# Patient Record
Sex: Female | Born: 1985 | Race: White | Hispanic: No | Marital: Married | Smoking: Never smoker
Health system: Southern US, Community
[De-identification: ages and names within clinical notes are randomized; demographics above are authoritative.]

## PROBLEM LIST (undated history)

## (undated) DIAGNOSIS — Z789 Other specified health status: Secondary | ICD-10-CM

## (undated) HISTORY — DX: Other specified health status: Z78.9

## (undated) HISTORY — PX: NO PAST SURGERIES: SHX2092

## (undated) HISTORY — PX: WISDOM TOOTH EXTRACTION: SHX21

---

## 2013-03-17 ENCOUNTER — Encounter: Payer: Self-pay | Admitting: *Deleted

## 2013-03-24 ENCOUNTER — Encounter: Payer: Self-pay | Admitting: Family

## 2013-03-24 ENCOUNTER — Ambulatory Visit (INDEPENDENT_AMBULATORY_CARE_PROVIDER_SITE_OTHER): Payer: BC Managed Care – PPO | Admitting: Family

## 2013-03-24 VITALS — BP 127/86 | Ht 66.0 in | Wt 159.9 lb

## 2013-03-24 DIAGNOSIS — Z3400 Encounter for supervision of normal first pregnancy, unspecified trimester: Secondary | ICD-10-CM | POA: Insufficient documentation

## 2013-03-24 LAB — POCT URINALYSIS DIP (DEVICE)
Bilirubin Urine: NEGATIVE
Hgb urine dipstick: NEGATIVE
Ketones, ur: NEGATIVE mg/dL
Protein, ur: NEGATIVE mg/dL
Specific Gravity, Urine: 1.015 (ref 1.005–1.030)
pH: 7.5 (ref 5.0–8.0)

## 2013-03-24 NOTE — Patient Instructions (Signed)
Second Trimester of Pregnancy The second trimester is from week 13 through week 28, months 4 through 6. The second trimester is often a time when you feel your best. Your body has also adjusted to being pregnant, and you begin to feel better physically. Usually, morning sickness has lessened or quit completely, you may have more energy, and you may have an increase in appetite. The second trimester is also a time when the fetus is growing rapidly. At the end of the sixth month, the fetus is about 9 inches long and weighs about 1 pounds. You will likely begin to feel the baby move (quickening) between 18 and 20 weeks of the pregnancy. BODY CHANGES Your body goes through many changes during pregnancy. The changes vary from woman to woman.   Your weight will continue to increase. You will notice your lower abdomen bulging out.  You may begin to get stretch marks on your hips, abdomen, and breasts.  You may develop headaches that can be relieved by medicines approved by your caregiver.  You may urinate more often because the fetus is pressing on your bladder.  You may develop or continue to have heartburn as a result of your pregnancy.  You may develop constipation because certain hormones are causing the muscles that push waste through your intestines to slow down.  You may develop hemorrhoids or swollen, bulging veins (varicose veins).  You may have back pain because of the weight gain and pregnancy hormones relaxing your joints between the bones in your pelvis and as a result of a shift in weight and the muscles that support your balance.  Your breasts will continue to grow and be tender.  Your gums may bleed and may be sensitive to brushing and flossing.  Dark spots or blotches (chloasma, mask of pregnancy) may develop on your face. This will likely fade after the baby is born.  A dark line from your belly button to the pubic area (linea nigra) may appear. This will likely fade after the  baby is born. WHAT TO EXPECT AT YOUR PRENATAL VISITS During a routine prenatal visit:  You will be weighed to make sure you and the fetus are growing normally.  Your blood pressure will be taken.  Your abdomen will be measured to track your baby's growth.  The fetal heartbeat will be listened to.  Any test results from the previous visit will be discussed. Your caregiver may ask you:  How you are feeling.  If you are feeling the baby move.  If you have had any abnormal symptoms, such as leaking fluid, bleeding, severe headaches, or abdominal cramping.  If you have any questions. Other tests that may be performed during your second trimester include:  Blood tests that check for:  Low iron levels (anemia).  Gestational diabetes (between 24 and 28 weeks).  Rh antibodies.  Urine tests to check for infections, diabetes, or protein in the urine.  An ultrasound to confirm the proper growth and development of the baby.  An amniocentesis to check for possible genetic problems.  Fetal screens for spina bifida and Down syndrome. HOME CARE INSTRUCTIONS   Avoid all smoking, herbs, alcohol, and unprescribed drugs. These chemicals affect the formation and growth of the baby.  Follow your caregiver's instructions regarding medicine use. There are medicines that are either safe or unsafe to take during pregnancy.  Exercise only as directed by your caregiver. Experiencing uterine cramps is a good sign to stop exercising.  Continue to eat regular,   healthy meals.  Wear a good support bra for breast tenderness.  Do not use hot tubs, steam rooms, or saunas.  Wear your seat belt at all times when driving.  Avoid raw meat, uncooked cheese, cat litter boxes, and soil used by cats. These carry germs that can cause birth defects in the baby.  Take your prenatal vitamins.  Try taking a stool softener (if your caregiver approves) if you develop constipation. Eat more high-fiber foods,  such as fresh vegetables or fruit and whole grains. Drink plenty of fluids to keep your urine clear or pale yellow.  Take warm sitz baths to soothe any pain or discomfort caused by hemorrhoids. Use hemorrhoid cream if your caregiver approves.  If you develop varicose veins, wear support hose. Elevate your feet for 15 minutes, 3 4 times a day. Limit salt in your diet.  Avoid heavy lifting, wear low heel shoes, and practice good posture.  Rest with your legs elevated if you have leg cramps or low back pain.  Visit your dentist if you have not gone yet during your pregnancy. Use a soft toothbrush to brush your teeth and be gentle when you floss.  A sexual relationship may be continued unless your caregiver directs you otherwise.  Continue to go to all your prenatal visits as directed by your caregiver. SEEK MEDICAL CARE IF:   You have dizziness.  You have mild pelvic cramps, pelvic pressure, or nagging pain in the abdominal area.  You have persistent nausea, vomiting, or diarrhea.  You have a bad smelling vaginal discharge.  You have pain with urination. SEEK IMMEDIATE MEDICAL CARE IF:   You have a fever.  You are leaking fluid from your vagina.  You have spotting or bleeding from your vagina.  You have severe abdominal cramping or pain.  You have rapid weight gain or loss.  You have shortness of breath with chest pain.  You notice sudden or extreme swelling of your face, hands, ankles, feet, or legs.  You have not felt your baby move in over an hour.  You have severe headaches that do not go away with medicine.  You have vision changes. Document Released: 04/09/2001 Document Revised: 12/16/2012 Document Reviewed: 06/16/2012 Exit[employer] Patient Information 2014 [employer].  

## 2013-03-24 NOTE — Progress Notes (Signed)
Pulse- 94 

## 2013-03-24 NOTE — Progress Notes (Signed)
   Subjective:    Danielle Rubio is a G1P0 [redacted]w[redacted]d being seen today for her first obstetrical visit.  Her obstetrical history is significant for no risk factors identified.[name] Desires waterbirth.  Getting married in January and also completing school (Education degree).   Patient does intend to breast feed. Pregnancy history fully reviewed.  Patient reports no complaints.  Filed Vitals:   03/24/13 0844 03/24/13 0847  BP: 127/86   Height:  5\' 6"  (1.676 m)  Weight: 72.53 kg (159 lb 14.4 oz)     HISTORY: OB History  Gravida Para Term Preterm AB SAB TAB Ectopic Multiple Living  1             # Outcome Date GA Lbr Len/2nd Weight Sex Delivery Anes PTL Lv  1 CUR              Past Medical History  Diagnosis Date  . Medical history non-contributory    Past Surgical History  Procedure Laterality Date  . Wisdom tooth extraction    . No past surgeries     Family History  Problem Relation Age of Onset  . Cancer Maternal Aunt     bladder cancer  . Alzheimer's disease Maternal Grandmother   . Cancer Maternal Grandfather     pancreatic     Exam   Exam   BP 127/86  Ht 5\' 6"  (1.676 m)  Wt 72.53 kg (159 lb 14.4 oz)  BMI 25.82 kg/m2  LMP 12/15/2012 Uterine Size: size equals dates  Pelvic Exam:    Perineum: No Hemorrhoids, Normal Perineum   Vulva: normal   Vagina:  normal mucosa, normal discharge, no palpable nodules   pH: Not done   Cervix: no bleeding following Pap, no cervical motion tenderness and no lesions   Adnexa: normal adnexa and no mass, fullness, tenderness   Bony Pelvis: Adequate  System: Breast:  No nipple retraction or dimpling, No nipple discharge or bleeding, No axillary or supraclavicular adenopathy, Normal to palpation without dominant masses   Skin: normal coloration and turgor, no rashes    Neurologic: negative   Extremities: normal strength, tone, and muscle mass   HEENT neck supple with midline trachea and thyroid without masses   Mouth/Teeth mucous  membranes moist, pharynx normal without lesions   Neck supple and no masses   Cardiovascular: regular rate and rhythm, no murmurs or gallops   Respiratory:  appears well, vitals normal, no respiratory distress, acyanotic, normal RR, neck free of mass or lymphadenopathy, chest clear, no wheezing, crepitations, rhonchi, normal symmetric air entry   Abdomen: soft, non-tender; bowel sounds normal; no masses,  no organomegaly   Urinary: urethral meatus normal       Assessment:    Pregnancy: G1P0 Patient Active Problem List   Diagnosis Date Noted  . Supervision of normal first pregnancy 03/24/2013        Plan:     Initial labs drawn. Prenatal vitamins. Problem list reviewed and updated. Genetic Screening discussed : : declined.  Ultrasound discussed; fetal survey: ordered in 4 weeks.  Follow up in 4 weeks. Discussed waterbirth class requirement at [facility].  Given information regarding local doula services.    [facility] 03/24/2013

## 2013-03-25 LAB — OBSTETRIC PANEL
Basophils Absolute: 0 10*3/uL (ref 0.0–0.1)
Basophils Relative: 0 % (ref 0–1)
Hemoglobin: 11.7 g/dL — ABNORMAL LOW (ref 12.0–15.0)
Hepatitis B Surface Ag: NEGATIVE
Lymphocytes Relative: 27 % (ref 12–46)
MCHC: 35 g/dL (ref 30.0–36.0)
Neutro Abs: 5 10*3/uL (ref 1.7–7.7)
Neutrophils Relative %: 66 % (ref 43–77)
RDW: 15 % (ref 11.5–15.5)
WBC: 7.6 10*3/uL (ref 4.0–10.5)

## 2013-03-25 LAB — PRESCRIPTION MONITORING PROFILE (19 PANEL)
Amphetamine/Meth: NEGATIVE ng/mL
Barbiturate Screen, Urine: NEGATIVE ng/mL
Benzodiazepine Screen, Urine: NEGATIVE ng/mL
Buprenorphine, Urine: NEGATIVE ng/mL
Cocaine Metabolites: NEGATIVE ng/mL
Creatinine, Urine: 88.86 mg/dL (ref >20.0)
Fentanyl, Ur: NEGATIVE ng/mL
MDMA URINE: NEGATIVE ng/mL
Meperidine, Ur: NEGATIVE ng/mL
Nitrites, Initial: NEGATIVE ug/mL
Opiate Screen, Urine: NEGATIVE ng/mL
Oxycodone Screen, Ur: NEGATIVE ng/mL
Propoxyphene: NEGATIVE ng/mL
Tramadol Scrn, Ur: NEGATIVE ng/mL
pH, Initial: 7.6 pH (ref 4.5–8.9)

## 2013-03-25 LAB — ALCOHOL METABOLITE (ETG), URINE: Ethyl Glucuronide (EtG): NEGATIVE ng/mL

## 2013-03-27 LAB — CULTURE, OB URINE

## 2013-03-30 ENCOUNTER — Other Ambulatory Visit: Payer: Self-pay | Admitting: Family

## 2013-03-30 ENCOUNTER — Encounter: Payer: Self-pay | Admitting: Family

## 2013-03-30 MED ORDER — AMOXICILLIN 500 MG PO CAPS: 500.0000 mg | ORAL_CAPSULE | Freq: Three times a day (TID) | ORAL | Status: DC

## 2013-03-30 NOTE — Progress Notes (Signed)
Pt notified regarding UTI and RX sent to pharmacy for Amoxicillin.

## 2013-04-21 ENCOUNTER — Other Ambulatory Visit: Payer: Self-pay | Admitting: Family

## 2013-04-21 ENCOUNTER — Ambulatory Visit (HOSPITAL_COMMUNITY)
Admission: RE | Admit: 2013-04-21 | Discharge: 2013-04-21 | Disposition: A | Discharge status: Home / Self Care | Payer: BC Managed Care – PPO | Source: Ambulatory Visit | Attending: Family | Admitting: Family

## 2013-04-21 ENCOUNTER — Ambulatory Visit (INDEPENDENT_AMBULATORY_CARE_PROVIDER_SITE_OTHER): Payer: BC Managed Care – PPO | Admitting: Obstetrics and Gynecology

## 2013-04-21 ENCOUNTER — Encounter: Payer: Self-pay | Admitting: Obstetrics and Gynecology

## 2013-04-21 VITALS — BP 114/70 | Wt 163.6 lb

## 2013-04-21 DIAGNOSIS — Z3402 Encounter for supervision of normal first pregnancy, second trimester: Secondary | ICD-10-CM

## 2013-04-21 DIAGNOSIS — Z3689 Encounter for other specified antenatal screening: Secondary | ICD-10-CM | POA: Insufficient documentation

## 2013-04-21 DIAGNOSIS — Z3400 Encounter for supervision of normal first pregnancy, unspecified trimester: Secondary | ICD-10-CM

## 2013-04-21 LAB — POCT URINALYSIS DIP (DEVICE)
Hgb urine dipstick: NEGATIVE
Nitrite: NEGATIVE
Protein, ur: NEGATIVE mg/dL
Specific Gravity, Urine: 1.01 (ref 1.005–1.030)
Urobilinogen, UA: 0.2 mg/dL (ref 0.0–1.0)
pH: 6 (ref 5.0–8.0)

## 2013-04-21 NOTE — Progress Notes (Signed)
Patient doing well without complaints. Anatomy report reviewed.

## 2013-04-21 NOTE — Progress Notes (Signed)
Pulse: 74

## 2013-04-22 ENCOUNTER — Encounter: Payer: Self-pay | Admitting: Family

## 2013-04-29 NOTE — L&D Delivery Note (Signed)
Attestation of Attending Supervision of Advanced Practitioner: Evaluation and management procedures were performed by the PA/NP/CNM/OB Fellow under my supervision/collaboration. Chart reviewed and agree with management and plan.  Eulon Allnutt V 10/07/2013 12:00 PM

## 2013-04-29 NOTE — L&D Delivery Note (Signed)
Delivery Note Pt started to receive Pitocin at approx 2am after 23hrs of PROM.  Shortly after 5am she was complete and began pushing. At 6:04 AM a viable female was delivered in the tub via  (Presentation: Left Occiput Anterior).  APGAR:7 ,8 ; weight: pending.  Infant delivered and lifted to pt's abd and dried. Cord clamped and cut by FOB. Placenta status: Intact, Spontaneous.  Cord: 3 vessels with the following complications: nuchal x 1, reduced after delivery.    Anesthesia: 1% lidocaine  Episiotomy: None Lacerations: right labial 'split' Suture Repair: 3.0 vicryl Est. Blood Loss (mL): 300  Mom to postpartum.  Baby to Couplet care / Skin to Skin.  [name] CNM 10/03/2013, 6:45 AM

## 2013-05-25 ENCOUNTER — Encounter: Payer: Self-pay | Admitting: Obstetrics and Gynecology

## 2013-05-25 ENCOUNTER — Ambulatory Visit (INDEPENDENT_AMBULATORY_CARE_PROVIDER_SITE_OTHER): Payer: BC Managed Care – PPO | Admitting: Obstetrics and Gynecology

## 2013-05-25 VITALS — BP 126/77 | Temp 97.1°F | Wt 171.0 lb

## 2013-05-25 DIAGNOSIS — Z3400 Encounter for supervision of normal first pregnancy, unspecified trimester: Secondary | ICD-10-CM

## 2013-05-25 LAB — POCT URINALYSIS DIP (DEVICE)
BILIRUBIN URINE: NEGATIVE
GLUCOSE, UA: NEGATIVE mg/dL
HGB URINE DIPSTICK: NEGATIVE
KETONES UR: NEGATIVE mg/dL
Leukocytes, UA: NEGATIVE
Nitrite: NEGATIVE
Protein, ur: NEGATIVE mg/dL
Urobilinogen, UA: 0.2 mg/dL (ref 0.0–1.0)
pH: 5.5 (ref 5.0–8.0)

## 2013-05-25 NOTE — Progress Notes (Signed)
Doing well. Systems [place]ud[employer] [place] for [place]birth. Reviewed [place] with pt.

## 2013-05-25 NOTE — Progress Notes (Signed)
p-77 

## 2013-05-25 NOTE — Patient Instructions (Signed)
Second Trimester of Pregnancy The second trimester is from week 13 through week 28, months 4 through 6. The second trimester is often a time when you feel your best. Your body has also adjusted to being pregnant, and you begin to feel better physically. Usually, morning sickness has lessened or quit completely, you may have more energy, and you may have an increase in appetite. The second trimester is also a time when the fetus is growing rapidly. At the end of the sixth month, the fetus is about 9 inches long and weighs about 1 pounds. You will likely begin to feel the baby move (quickening) between 18 and 20 weeks of the pregnancy. BODY CHANGES Your body goes through many changes during pregnancy. The changes vary from woman to woman.   Your weight will continue to increase. You will notice your lower abdomen bulging out.  You may begin to get stretch marks on your hips, abdomen, and breasts.  You may develop headaches that can be relieved by medicines approved by your caregiver.  You may urinate more often because the fetus is pressing on your bladder.  You may develop or continue to have heartburn as a result of your pregnancy.  You may develop constipation because certain hormones are causing the muscles that push waste through your intestines to slow down.  You may develop hemorrhoids or swollen, bulging veins (varicose veins).  You may have back pain because of the weight gain and pregnancy hormones relaxing your joints between the bones in your pelvis and as a result of a shift in weight and the muscles that support your balance.  Your breasts will continue to grow and be tender.  Your gums may bleed and may be sensitive to brushing and flossing.  Dark spots or blotches (chloasma, mask of pregnancy) may develop on your face. This will likely fade after the baby is born.  A dark line from your belly button to the pubic area (linea nigra) may appear. This will likely fade after the  baby is born. WHAT TO EXPECT AT YOUR PRENATAL VISITS During a routine prenatal visit:  You will be weighed to make sure you and the fetus are growing normally.  Your blood pressure will be taken.  Your abdomen will be measured to track your baby's growth.  The fetal heartbeat will be listened to.  Any test results from the previous visit will be discussed. Your caregiver may ask you:  How you are feeling.  If you are feeling the baby move.  If you have had any abnormal symptoms, such as leaking fluid, bleeding, severe headaches, or abdominal cramping.  If you have any questions. Other tests that may be performed during your second trimester include:  Blood tests that check for:  Low iron levels (anemia).  Gestational diabetes (between 24 and 28 weeks).  Rh antibodies.  Urine tests to check for infections, diabetes, or protein in the urine.  An ultrasound to confirm the proper growth and development of the baby.  An amniocentesis to check for possible genetic problems.  Fetal screens for spina bifida and Down syndrome. HOME CARE INSTRUCTIONS   Avoid all smoking, herbs, alcohol, and unprescribed drugs. These chemicals affect the formation and growth of the baby.  Follow your caregiver's instructions regarding medicine use. There are medicines that are either safe or unsafe to take during pregnancy.  Exercise only as directed by your caregiver. Experiencing uterine cramps is a good sign to stop exercising.  Continue to eat regular,   healthy meals.  Wear a good support bra for breast tenderness.  Do not use hot tubs, steam rooms, or saunas.  Wear your seat belt at all times when driving.  Avoid raw meat, uncooked cheese, cat litter boxes, and soil used by cats. These carry germs that can cause birth defects in the baby.  Take your prenatal vitamins.  Try taking a stool softener (if your caregiver approves) if you develop constipation. Eat more high-fiber foods,  such as fresh vegetables or fruit and whole grains. Drink plenty of fluids to keep your urine clear or pale yellow.  Take warm sitz baths to soothe any pain or discomfort caused by hemorrhoids. Use hemorrhoid cream if your caregiver approves.  If you develop varicose veins, wear support hose. Elevate your feet for 15 minutes, 3 4 times a day. Limit salt in your diet.  Avoid heavy lifting, wear low heel shoes, and practice good posture.  Rest with your legs elevated if you have leg cramps or low back pain.  Visit your dentist if you have not gone yet during your pregnancy. Use a soft toothbrush to brush your teeth and be gentle when you floss.  A sexual relationship may be continued unless your caregiver directs you otherwise.  Continue to go to all your prenatal visits as directed by your caregiver. SEEK MEDICAL CARE IF:   You have dizziness.  You have mild pelvic cramps, pelvic pressure, or nagging pain in the abdominal area.  You have persistent nausea, vomiting, or diarrhea.  You have a bad smelling vaginal discharge.  You have pain with urination. SEEK IMMEDIATE MEDICAL CARE IF:   You have a fever.  You are leaking fluid from your vagina.  You have spotting or bleeding from your vagina.  You have severe abdominal cramping or pain.  You have rapid weight gain or loss.  You have shortness of breath with chest pain.  You notice sudden or extreme swelling of your face, hands, ankles, feet, or legs.  You have not felt your baby move in over an hour.  You have severe headaches that do not go away with medicine.  You have vision changes. Document Released: 04/09/2001 Document Revised: 12/16/2012 Document Reviewed: 06/16/2012 Exit[employer] Patient Information 2014 [employer].  

## 2013-06-22 ENCOUNTER — Encounter: Payer: Self-pay | Admitting: Family Medicine

## 2013-06-22 ENCOUNTER — Ambulatory Visit (INDEPENDENT_AMBULATORY_CARE_PROVIDER_SITE_OTHER): Payer: BC Managed Care – PPO | Admitting: Family Medicine

## 2013-06-22 VITALS — BP 131/78 | Temp 96.4°F | Wt 181.3 lb

## 2013-06-22 DIAGNOSIS — Z3400 Encounter for supervision of normal first pregnancy, unspecified trimester: Secondary | ICD-10-CM

## 2013-06-22 LAB — POCT URINALYSIS DIP (DEVICE)
BILIRUBIN URINE: NEGATIVE
GLUCOSE, UA: NEGATIVE mg/dL
HGB URINE DIPSTICK: NEGATIVE
Ketones, ur: NEGATIVE mg/dL
Leukocytes, UA: NEGATIVE
Nitrite: NEGATIVE
Protein, ur: NEGATIVE mg/dL
SPECIFIC GRAVITY, URINE: 1.01 (ref 1.005–1.030)
UROBILINOGEN UA: 0.2 mg/dL (ref 0.0–1.0)
pH: 6 (ref 5.0–8.0)

## 2013-06-22 LAB — CBC
HEMATOCRIT: 32.6 % — AB (ref 36.0–46.0)
HEMOGLOBIN: 11.2 g/dL — AB (ref 12.0–15.0)
MCH: 31.7 pg (ref 26.0–34.0)
MCHC: 34.4 g/dL (ref 30.0–36.0)
MCV: 92.4 fL (ref 78.0–100.0)
Platelets: 181 10*3/uL (ref 150–400)
RBC: 3.53 MIL/uL — AB (ref 3.87–5.11)
RDW: 13.4 % (ref 11.5–15.5)
WBC: 8.9 10*3/uL (ref 4.0–10.5)

## 2013-06-22 NOTE — Progress Notes (Signed)
S: 28 yo G1 @ 3666w0d - still having occasional constipation - some heartburn - taking natural remedies - no ctx, lof, vb.  +FM  O: see flowsheet  A/P: - doing well _ PTL precautions discussed - 28 week labs today - f/u iat 30 weeks

## 2013-06-22 NOTE — Patient Instructions (Signed)
Third Trimester of Pregnancy  The third trimester is from week 29 through week 42, months 7 through 9. The third trimester is a time when the fetus is growing rapidly. At the end of the ninth month, the fetus is about 20 inches in length and weighs 6 10 pounds.   BODY CHANGES  Your body goes through many changes during pregnancy. The changes vary from woman to woman.    Your weight will continue to increase. You can expect to gain 25 35 pounds (11 16 kg) by the end of the pregnancy.   You may begin to get stretch marks on your hips, abdomen, and breasts.   You may urinate more often because the fetus is moving lower into your pelvis and pressing on your bladder.   You may develop or continue to have heartburn as a result of your pregnancy.   You may develop constipation because certain hormones are causing the muscles that push waste through your intestines to slow down.   You may develop hemorrhoids or swollen, bulging veins (varicose veins).   You may have pelvic pain because of the weight gain and pregnancy hormones relaxing your joints between the bones in your pelvis. Back aches may result from over exertion of the muscles supporting your posture.   Your breasts will continue to grow and be tender. A yellow discharge may leak from your breasts called colostrum.   Your belly button may stick out.   You may feel short of breath because of your expanding uterus.   You may notice the fetus "dropping," or moving lower in your abdomen.   You may have a bloody mucus discharge. This usually occurs a few days to a week before labor begins.   Your cervix becomes thin and soft (effaced) near your due date.  WHAT TO EXPECT AT YOUR PRENATAL EXAMS   You will have prenatal exams every 2 weeks until week 36. Then, you will have weekly prenatal exams. During a routine prenatal visit:   You will be weighed to make sure you and the fetus are growing normally.   Your blood pressure is taken.   Your abdomen will be  measured to track your baby's growth.   The fetal heartbeat will be listened to.   Any test results from the previous visit will be discussed.   You may have a cervical check near your due date to see if you have effaced.  At around 36 weeks, your caregiver will check your cervix. At the same time, your caregiver will also perform a test on the secretions of the vaginal tissue. This test is to determine if a type of bacteria, Group B streptococcus, is present. Your caregiver will explain this further.  Your caregiver may ask you:   What your birth plan is.   How you are feeling.   If you are feeling the baby move.   If you have had any abnormal symptoms, such as leaking fluid, bleeding, severe headaches, or abdominal cramping.   If you have any questions.  Other tests or screenings that may be performed during your third trimester include:   Blood tests that check for low iron levels (anemia).   Fetal testing to check the health, activity level, and growth of the fetus. Testing is done if you have certain medical conditions or if there are problems during the pregnancy.  FALSE LABOR  You may feel small, irregular contractions that eventually go away. These are called Braxton Hicks contractions, or   false labor. Contractions may last for hours, days, or even weeks before true labor sets in. If contractions come at regular intervals, intensify, or become painful, it is best to be seen by your caregiver.   SIGNS OF LABOR    Menstrual-like cramps.   Contractions that are 5 minutes apart or less.   Contractions that start on the top of the uterus and spread down to the lower abdomen and back.   A sense of increased pelvic pressure or back pain.   A watery or bloody mucus discharge that comes from the vagina.  If you have any of these signs before the 37th week of pregnancy, call your caregiver right away. You need to go to the hospital to get checked immediately.  HOME CARE INSTRUCTIONS    Avoid all  smoking, herbs, alcohol, and unprescribed drugs. These chemicals affect the formation and growth of the baby.   Follow your caregiver's instructions regarding medicine use. There are medicines that are either safe or unsafe to take during pregnancy.   Exercise only as directed by your caregiver. Experiencing uterine cramps is a good sign to stop exercising.   Continue to eat regular, healthy meals.   Wear a good support bra for breast tenderness.   Do not use hot tubs, steam rooms, or saunas.   Wear your seat belt at all times when driving.   Avoid raw meat, uncooked cheese, cat litter boxes, and soil used by cats. These carry germs that can cause birth defects in the baby.   Take your prenatal vitamins.   Try taking a stool softener (if your caregiver approves) if you develop constipation. Eat more high-fiber foods, such as fresh vegetables or fruit and whole grains. Drink plenty of fluids to keep your urine clear or pale yellow.   Take warm sitz baths to soothe any pain or discomfort caused by hemorrhoids. Use hemorrhoid cream if your caregiver approves.   If you develop varicose veins, wear support hose. Elevate your feet for 15 minutes, 3 4 times a day. Limit salt in your diet.   Avoid heavy lifting, wear low heal shoes, and practice good posture.   Rest a lot with your legs elevated if you have leg cramps or low back pain.   Visit your dentist if you have not gone during your pregnancy. Use a soft toothbrush to brush your teeth and be gentle when you floss.   A sexual relationship may be continued unless your caregiver directs you otherwise.   Do not travel far distances unless it is absolutely necessary and only with the approval of your caregiver.   Take prenatal classes to understand, practice, and ask questions about the labor and delivery.   Make a trial run to the hospital.   Pack your hospital bag.   Prepare the baby's nursery.   Continue to go to all your prenatal visits as directed  by your caregiver.  SEEK MEDICAL CARE IF:   You are unsure if you are in labor or if your water has broken.   You have dizziness.   You have mild pelvic cramps, pelvic pressure, or nagging pain in your abdominal area.   You have persistent nausea, vomiting, or diarrhea.   You have a bad smelling vaginal discharge.   You have pain with urination.  SEEK IMMEDIATE MEDICAL CARE IF:    You have a fever.   You are leaking fluid from your vagina.   You have spotting or bleeding from your vagina.     You have severe abdominal cramping or pain.   You have rapid weight loss or gain.   You have shortness of breath with chest pain.   You notice sudden or extreme swelling of your face, hands, ankles, feet, or legs.   You have not felt your baby move in over an hour.   You have severe headaches that do not go away with medicine.   You have vision changes.  Document Released: 04/09/2001 Document Revised: 12/16/2012 Document Reviewed: 06/16/2012  ExitCare Patient Information 2014 [employer].

## 2013-06-22 NOTE — Progress Notes (Signed)
Pulse- 96  Edema-ankles

## 2013-06-23 LAB — HIV ANTIBODY (ROUTINE TESTING W REFLEX): HIV: NONREACTIVE

## 2013-06-23 LAB — GLUCOSE TOLERANCE, 1 HOUR (50G) W/O FASTING: Glucose, 1 Hour GTT: 124 mg/dL (ref 70–140)

## 2013-06-23 LAB — SYPHILIS: RPR W/REFLEX TO RPR TITER AND TREPONEMAL ANTIBODIES, TRADITIONAL SCREENING AND DIAGNOSIS ALGORITHM

## 2013-06-25 ENCOUNTER — Encounter: Payer: Self-pay | Admitting: Family Medicine

## 2013-07-13 ENCOUNTER — Ambulatory Visit (INDEPENDENT_AMBULATORY_CARE_PROVIDER_SITE_OTHER): Payer: BC Managed Care – PPO | Admitting: Obstetrics and Gynecology

## 2013-07-13 VITALS — Temp 97.5°F | Wt 184.8 lb

## 2013-07-13 DIAGNOSIS — Z3400 Encounter for supervision of normal first pregnancy, unspecified trimester: Secondary | ICD-10-CM

## 2013-07-13 LAB — POCT URINALYSIS DIP (DEVICE)
BILIRUBIN URINE: NEGATIVE
GLUCOSE, UA: NEGATIVE mg/dL
HGB URINE DIPSTICK: NEGATIVE
Ketones, ur: 15 mg/dL — AB
LEUKOCYTES UA: NEGATIVE
NITRITE: NEGATIVE
PH: 5.5 (ref 5.0–8.0)
Protein, ur: NEGATIVE mg/dL
Specific Gravity, Urine: <=1.005 (ref 1.005–1.030)
Urobilinogen, UA: 0.2 mg/dL (ref 0.0–1.0)

## 2013-07-13 NOTE — Progress Notes (Signed)
P= 88 Mild edema in ankles especially after working.  Pt. States she got tdap in October but has been waiting on getting the records.

## 2013-07-13 NOTE — Progress Notes (Signed)
Evaluation and management procedures were performed by SNM under my supervision/collaboration. Chart reviewed, patient examined by me and I agree with management and plan. 

## 2013-07-13 NOTE — Progress Notes (Signed)
Pt has no complaints.  Reports occasional contractions, +FM. Denies LOF and VB.  Requested that next visit be in 3 wks because she lives 45 min away.

## 2013-07-13 NOTE — Patient Instructions (Signed)
Third Trimester of Pregnancy  The third trimester is from week 29 through week 42, months 7 through 9. The third trimester is a time when the fetus is growing rapidly. At the end of the ninth month, the fetus is about 20 inches in length and weighs 6 10 pounds.   BODY CHANGES  Your body goes through many changes during pregnancy. The changes vary from woman to woman.    Your weight will continue to increase. You can expect to gain 25 35 pounds (11 16 kg) by the end of the pregnancy.   You may begin to get stretch marks on your hips, abdomen, and breasts.   You may urinate more often because the fetus is moving lower into your pelvis and pressing on your bladder.   You may develop or continue to have heartburn as a result of your pregnancy.   You may develop constipation because certain hormones are causing the muscles that push waste through your intestines to slow down.   You may develop hemorrhoids or swollen, bulging veins (varicose veins).   You may have pelvic pain because of the weight gain and pregnancy hormones relaxing your joints between the bones in your pelvis. Back aches may result from over exertion of the muscles supporting your posture.   Your breasts will continue to grow and be tender. A yellow discharge may leak from your breasts called colostrum.   Your belly button may stick out.   You may feel short of breath because of your expanding uterus.   You may notice the fetus "dropping," or moving lower in your abdomen.   You may have a bloody mucus discharge. This usually occurs a few days to a week before labor begins.   Your cervix becomes thin and soft (effaced) near your due date.  WHAT TO EXPECT AT YOUR PRENATAL EXAMS   You will have prenatal exams every 2 weeks until week 36. Then, you will have weekly prenatal exams. During a routine prenatal visit:   You will be weighed to make sure you and the fetus are growing normally.   Your blood pressure is taken.   Your abdomen will be  measured to track your baby's growth.   The fetal heartbeat will be listened to.   Any test results from the previous visit will be discussed.   You may have a cervical check near your due date to see if you have effaced.  At around 36 weeks, your caregiver will check your cervix. At the same time, your caregiver will also perform a test on the secretions of the vaginal tissue. This test is to determine if a type of bacteria, Group B streptococcus, is present. Your caregiver will explain this further.  Your caregiver may ask you:   What your birth plan is.   How you are feeling.   If you are feeling the baby move.   If you have had any abnormal symptoms, such as leaking fluid, bleeding, severe headaches, or abdominal cramping.   If you have any questions.  Other tests or screenings that may be performed during your third trimester include:   Blood tests that check for low iron levels (anemia).   Fetal testing to check the health, activity level, and growth of the fetus. Testing is done if you have certain medical conditions or if there are problems during the pregnancy.  FALSE LABOR  You may feel small, irregular contractions that eventually go away. These are called Braxton Hicks contractions, or   false labor. Contractions may last for hours, days, or even weeks before true labor sets in. If contractions come at regular intervals, intensify, or become painful, it is best to be seen by your caregiver.   SIGNS OF LABOR    Menstrual-like cramps.   Contractions that are 5 minutes apart or less.   Contractions that start on the top of the uterus and spread down to the lower abdomen and back.   A sense of increased pelvic pressure or back pain.   A watery or bloody mucus discharge that comes from the vagina.  If you have any of these signs before the 37th week of pregnancy, call your caregiver right away. You need to go to the hospital to get checked immediately.  HOME CARE INSTRUCTIONS    Avoid all  smoking, herbs, alcohol, and unprescribed drugs. These chemicals affect the formation and growth of the baby.   Follow your caregiver's instructions regarding medicine use. There are medicines that are either safe or unsafe to take during pregnancy.   Exercise only as directed by your caregiver. Experiencing uterine cramps is a good sign to stop exercising.   Continue to eat regular, healthy meals.   Wear a good support bra for breast tenderness.   Do not use hot tubs, steam rooms, or saunas.   Wear your seat belt at all times when driving.   Avoid raw meat, uncooked cheese, cat litter boxes, and soil used by cats. These carry germs that can cause birth defects in the baby.   Take your prenatal vitamins.   Try taking a stool softener (if your caregiver approves) if you develop constipation. Eat more high-fiber foods, such as fresh vegetables or fruit and whole grains. Drink plenty of fluids to keep your urine clear or pale yellow.   Take warm sitz baths to soothe any pain or discomfort caused by hemorrhoids. Use hemorrhoid cream if your caregiver approves.   If you develop varicose veins, wear support hose. Elevate your feet for 15 minutes, 3 4 times a day. Limit salt in your diet.   Avoid heavy lifting, wear low heal shoes, and practice good posture.   Rest a lot with your legs elevated if you have leg cramps or low back pain.   Visit your dentist if you have not gone during your pregnancy. Use a soft toothbrush to brush your teeth and be gentle when you floss.   A sexual relationship may be continued unless your caregiver directs you otherwise.   Do not travel far distances unless it is absolutely necessary and only with the approval of your caregiver.   Take prenatal classes to understand, practice, and ask questions about the labor and delivery.   Make a trial run to the hospital.   Pack your hospital bag.   Prepare the baby's nursery.   Continue to go to all your prenatal visits as directed  by your caregiver.  SEEK MEDICAL CARE IF:   You are unsure if you are in labor or if your water has broken.   You have dizziness.   You have mild pelvic cramps, pelvic pressure, or nagging pain in your abdominal area.   You have persistent nausea, vomiting, or diarrhea.   You have a bad smelling vaginal discharge.   You have pain with urination.  SEEK IMMEDIATE MEDICAL CARE IF:    You have a fever.   You are leaking fluid from your vagina.   You have spotting or bleeding from your vagina.     You have severe abdominal cramping or pain.   You have rapid weight loss or gain.   You have shortness of breath with chest pain.   You notice sudden or extreme swelling of your face, hands, ankles, feet, or legs.   You have not felt your baby move in over an hour.   You have severe headaches that do not go away with medicine.   You have vision changes.  Document Released: 04/09/2001 Document Revised: 12/16/2012 Document Reviewed: 06/16/2012  ExitCare Patient Information 2014 [employer].

## 2013-07-14 ENCOUNTER — Encounter: Payer: BC Managed Care – PPO | Admitting: Advanced Practice Midwife

## 2013-07-21 ENCOUNTER — Telehealth: Payer: Self-pay | Admitting: General Practice

## 2013-07-21 NOTE — Telephone Encounter (Signed)
patient called and left message stating that she is 31weeks now and up to this point she has been feeling okay but now she is starting to have a little pain in her pelvis and hips and wants to make sure this is okay. Called patient back and she stated they went out of town last week and she did a bunch of walking and is feeling sore now and still having some discomfort in hips and wasn't sure if this was normal since she is later in her pregnancy. Told patient that pressure and pain in her hips can be normal at this point but that severe pain wouldn't be and that if she is having mild pains she can try a heating pad or tylenol. Patient verbalized understanding and had no further questions

## 2013-07-28 ENCOUNTER — Encounter: Payer: Self-pay | Admitting: *Deleted

## 2013-08-03 ENCOUNTER — Ambulatory Visit (INDEPENDENT_AMBULATORY_CARE_PROVIDER_SITE_OTHER): Payer: BC Managed Care – PPO | Admitting: Family Medicine

## 2013-08-03 ENCOUNTER — Encounter: Payer: Self-pay | Admitting: Family Medicine

## 2013-08-03 VITALS — BP 124/81 | Temp 98.2°F | Wt 187.8 lb

## 2013-08-03 DIAGNOSIS — Z23 Encounter for immunization: Secondary | ICD-10-CM

## 2013-08-03 DIAGNOSIS — Z3400 Encounter for supervision of normal first pregnancy, unspecified trimester: Secondary | ICD-10-CM

## 2013-08-03 LAB — POCT URINALYSIS DIP (DEVICE)
BILIRUBIN URINE: NEGATIVE
Glucose, UA: NEGATIVE mg/dL
Hgb urine dipstick: NEGATIVE
Ketones, ur: NEGATIVE mg/dL
LEUKOCYTES UA: NEGATIVE
Nitrite: NEGATIVE
Protein, ur: NEGATIVE mg/dL
Specific Gravity, Urine: <=1.005 (ref 1.005–1.030)
Urobilinogen, UA: 0.2 mg/dL (ref 0.0–1.0)
pH: 6.5 (ref 5.0–8.0)

## 2013-08-03 MED ORDER — TETANUS-DIPHTH-ACELL PERTUSSIS 5-2.5-18.5 LF-MCG/0.5 IM SUSP: 0.5000 mL | Freq: Once | INTRAMUSCULAR | Status: DC

## 2013-08-03 NOTE — Patient Instructions (Signed)
Third Trimester of Pregnancy  The third trimester is from week 29 through week 42, months 7 through 9. The third trimester is a time when the fetus is growing rapidly. At the end of the ninth month, the fetus is about 20 inches in length and weighs 6 10 pounds.   BODY CHANGES  Your body goes through many changes during pregnancy. The changes vary from woman to woman.    Your weight will continue to increase. You can expect to gain 25 35 pounds (11 16 kg) by the end of the pregnancy.   You may begin to get stretch marks on your hips, abdomen, and breasts.   You may urinate more often because the fetus is moving lower into your pelvis and pressing on your bladder.   You may develop or continue to have heartburn as a result of your pregnancy.   You may develop constipation because certain hormones are causing the muscles that push waste through your intestines to slow down.   You may develop hemorrhoids or swollen, bulging veins (varicose veins).   You may have pelvic pain because of the weight gain and pregnancy hormones relaxing your joints between the bones in your pelvis. Back aches may result from over exertion of the muscles supporting your posture.   Your breasts will continue to grow and be tender. A yellow discharge may leak from your breasts called colostrum.   Your belly button may stick out.   You may feel short of breath because of your expanding uterus.   You may notice the fetus "dropping," or moving lower in your abdomen.   You may have a bloody mucus discharge. This usually occurs a few days to a week before labor begins.   Your cervix becomes thin and soft (effaced) near your due date.  WHAT TO EXPECT AT YOUR PRENATAL EXAMS   You will have prenatal exams every 2 weeks until week 36. Then, you will have weekly prenatal exams. During a routine prenatal visit:   You will be weighed to make sure you and the fetus are growing normally.   Your blood pressure is taken.   Your abdomen will be  measured to track your baby's growth.   The fetal heartbeat will be listened to.   Any test results from the previous visit will be discussed.   You may have a cervical check near your due date to see if you have effaced.  At around 36 weeks, your caregiver will check your cervix. At the same time, your caregiver will also perform a test on the secretions of the vaginal tissue. This test is to determine if a type of bacteria, Group B streptococcus, is present. Your caregiver will explain this further.  Your caregiver may ask you:   What your birth plan is.   How you are feeling.   If you are feeling the baby move.   If you have had any abnormal symptoms, such as leaking fluid, bleeding, severe headaches, or abdominal cramping.   If you have any questions.  Other tests or screenings that may be performed during your third trimester include:   Blood tests that check for low iron levels (anemia).   Fetal testing to check the health, activity level, and growth of the fetus. Testing is done if you have certain medical conditions or if there are problems during the pregnancy.  FALSE LABOR  You may feel small, irregular contractions that eventually go away. These are called Braxton Hicks contractions, or   false labor. Contractions may last for hours, days, or even weeks before true labor sets in. If contractions come at regular intervals, intensify, or become painful, it is best to be seen by your caregiver.   SIGNS OF LABOR    Menstrual-like cramps.   Contractions that are 5 minutes apart or less.   Contractions that start on the top of the uterus and spread down to the lower abdomen and back.   A sense of increased pelvic pressure or back pain.   A watery or bloody mucus discharge that comes from the vagina.  If you have any of these signs before the 37th week of pregnancy, call your caregiver right away. You need to go to the hospital to get checked immediately.  HOME CARE INSTRUCTIONS    Avoid all  smoking, herbs, alcohol, and unprescribed drugs. These chemicals affect the formation and growth of the baby.   Follow your caregiver's instructions regarding medicine use. There are medicines that are either safe or unsafe to take during pregnancy.   Exercise only as directed by your caregiver. Experiencing uterine cramps is a good sign to stop exercising.   Continue to eat regular, healthy meals.   Wear a good support bra for breast tenderness.   Do not use hot tubs, steam rooms, or saunas.   Wear your seat belt at all times when driving.   Avoid raw meat, uncooked cheese, cat litter boxes, and soil used by cats. These carry germs that can cause birth defects in the baby.   Take your prenatal vitamins.   Try taking a stool softener (if your caregiver approves) if you develop constipation. Eat more high-fiber foods, such as fresh vegetables or fruit and whole grains. Drink plenty of fluids to keep your urine clear or pale yellow.   Take warm sitz baths to soothe any pain or discomfort caused by hemorrhoids. Use hemorrhoid cream if your caregiver approves.   If you develop varicose veins, wear support hose. Elevate your feet for 15 minutes, 3 4 times a day. Limit salt in your diet.   Avoid heavy lifting, wear low heal shoes, and practice good posture.   Rest a lot with your legs elevated if you have leg cramps or low back pain.   Visit your dentist if you have not gone during your pregnancy. Use a soft toothbrush to brush your teeth and be gentle when you floss.   A sexual relationship may be continued unless your caregiver directs you otherwise.   Do not travel far distances unless it is absolutely necessary and only with the approval of your caregiver.   Take prenatal classes to understand, practice, and ask questions about the labor and delivery.   Make a trial run to the hospital.   Pack your hospital bag.   Prepare the baby's nursery.   Continue to go to all your prenatal visits as directed  by your caregiver.  SEEK MEDICAL CARE IF:   You are unsure if you are in labor or if your water has broken.   You have dizziness.   You have mild pelvic cramps, pelvic pressure, or nagging pain in your abdominal area.   You have persistent nausea, vomiting, or diarrhea.   You have a bad smelling vaginal discharge.   You have pain with urination.  SEEK IMMEDIATE MEDICAL CARE IF:    You have a fever.   You are leaking fluid from your vagina.   You have spotting or bleeding from your vagina.     You have severe abdominal cramping or pain.   You have rapid weight loss or gain.   You have shortness of breath with chest pain.   You notice sudden or extreme swelling of your face, hands, ankles, feet, or legs.   You have not felt your baby move in over an hour.   You have severe headaches that do not go away with medicine.   You have vision changes.  Document Released: 04/09/2001 Document Revised: 12/16/2012 Document Reviewed: 06/16/2012  ExitCare Patient Information 2014 [employer].

## 2013-08-03 NOTE — Progress Notes (Signed)
+  FM, no lof, no vb, 1 ctx  Hip pain improving. Near syncope at Dentist  Danielle Rubio is a 28 y.o. G1P0 at 5826w0d by L=18 here for ROB visit.  Discussed with Patient:  -Plans to breast feed.  All questions answered. -Continue prenatal vitamins. -Reviewed fetal kick counts Pt to perform daily at a time when the baby is active, lie laterally with both hands on belly in quiet room and count all movements (hiccups, shoulder rolls, obvious kicks, etc); pt is to report to clinic L&D for less than 10 movements felt in a one hour time period-pt told as soon as she counts 10 movements the count is complete.  - Routine precautions discussed (depression, infection s/s).   Patient provided with all pertinent phone numbers for emergencies. - RTC for any VB, regular, painful cramps/ctxs occurring at a rate of >2/10 min, fever (100.5 or higher), n/v/d, any pain that is unresolving or worsening, LOF, decreased fetal movement, CP, SOB, edema - RTC in 2 weeks for next appt.  Problems: Patient Active Problem List   Diagnosis Date Noted  . Supervision of normal first pregnancy 03/24/2013    To Do:  [ ]  Vaccines: tdap rec'd [ x] BCM: condoms [ x] Readiness: baby has a place to sleep, car seat, other baby necessities.  Edu: [x ] PTL precautions; [ ]  BF class; [ ]  childbirth class; [ ]   BF counseling;

## 2013-08-03 NOTE — Progress Notes (Signed)
PP=99,=  C/o pain in right hip since last visit. States was intense first week, but not as bad now. C/o occasional contractions.

## 2013-08-18 ENCOUNTER — Encounter: Payer: Self-pay | Admitting: Family Medicine

## 2013-08-18 ENCOUNTER — Ambulatory Visit (INDEPENDENT_AMBULATORY_CARE_PROVIDER_SITE_OTHER): Payer: BC Managed Care – PPO | Admitting: Family Medicine

## 2013-08-18 VITALS — BP 125/74 | HR 90 | Temp 97.1°F | Wt 187.3 lb

## 2013-08-18 DIAGNOSIS — Z3400 Encounter for supervision of normal first pregnancy, unspecified trimester: Secondary | ICD-10-CM

## 2013-08-18 LAB — POCT URINALYSIS DIP (DEVICE)
Bilirubin Urine: NEGATIVE
GLUCOSE, UA: NEGATIVE mg/dL
Hgb urine dipstick: NEGATIVE
KETONES UR: NEGATIVE mg/dL
Leukocytes, UA: NEGATIVE
Nitrite: NEGATIVE
Protein, ur: NEGATIVE mg/dL
Urobilinogen, UA: 0.2 mg/dL (ref 0.0–1.0)
pH: 5.5 (ref 5.0–8.0)

## 2013-08-18 NOTE — Progress Notes (Signed)
S: 28 yo G1 @[redacted]w[redacted]d  here for ROBV - doing well - no ctx. No lof, vb.  +FM  O: see flowsheet   A/P - doing well - labor precautions discussed - cultures to be done next week - measuring slightly behind but not concerning at this point. Cont to monitor.   - still plans waterbirth. Consent signed. Classes attended last week.

## 2013-08-18 NOTE — Patient Instructions (Signed)
Third Trimester of Pregnancy  The third trimester is from week 29 through week 42, months 7 through 9. The third trimester is a time when the fetus is growing rapidly. At the end of the ninth month, the fetus is about 20 inches in length and weighs 6 10 pounds.   BODY CHANGES  Your body goes through many changes during pregnancy. The changes vary from woman to woman.    Your weight will continue to increase. You can expect to gain 25 35 pounds (11 16 kg) by the end of the pregnancy.   You may begin to get stretch marks on your hips, abdomen, and breasts.   You may urinate more often because the fetus is moving lower into your pelvis and pressing on your bladder.   You may develop or continue to have heartburn as a result of your pregnancy.   You may develop constipation because certain hormones are causing the muscles that push waste through your intestines to slow down.   You may develop hemorrhoids or swollen, bulging veins (varicose veins).   You may have pelvic pain because of the weight gain and pregnancy hormones relaxing your joints between the bones in your pelvis. Back aches may result from over exertion of the muscles supporting your posture.   Your breasts will continue to grow and be tender. A yellow discharge may leak from your breasts called colostrum.   Your belly button may stick out.   You may feel short of breath because of your expanding uterus.   You may notice the fetus "dropping," or moving lower in your abdomen.   You may have a bloody mucus discharge. This usually occurs a few days to a week before labor begins.   Your cervix becomes thin and soft (effaced) near your due date.  WHAT TO EXPECT AT YOUR PRENATAL EXAMS   You will have prenatal exams every 2 weeks until week 36. Then, you will have weekly prenatal exams. During a routine prenatal visit:   You will be weighed to make sure you and the fetus are growing normally.   Your blood pressure is taken.   Your abdomen will be  measured to track your baby's growth.   The fetal heartbeat will be listened to.   Any test results from the previous visit will be discussed.   You may have a cervical check near your due date to see if you have effaced.  At around 36 weeks, your caregiver will check your cervix. At the same time, your caregiver will also perform a test on the secretions of the vaginal tissue. This test is to determine if a type of bacteria, Group B streptococcus, is present. Your caregiver will explain this further.  Your caregiver may ask you:   What your birth plan is.   How you are feeling.   If you are feeling the baby move.   If you have had any abnormal symptoms, such as leaking fluid, bleeding, severe headaches, or abdominal cramping.   If you have any questions.  Other tests or screenings that may be performed during your third trimester include:   Blood tests that check for low iron levels (anemia).   Fetal testing to check the health, activity level, and growth of the fetus. Testing is done if you have certain medical conditions or if there are problems during the pregnancy.  FALSE LABOR  You may feel small, irregular contractions that eventually go away. These are called Braxton Hicks contractions, or   false labor. Contractions may last for hours, days, or even weeks before true labor sets in. If contractions come at regular intervals, intensify, or become painful, it is best to be seen by your caregiver.   SIGNS OF LABOR    Menstrual-like cramps.   Contractions that are 5 minutes apart or less.   Contractions that start on the top of the uterus and spread down to the lower abdomen and back.   A sense of increased pelvic pressure or back pain.   A watery or bloody mucus discharge that comes from the vagina.  If you have any of these signs before the 37th week of pregnancy, call your caregiver right away. You need to go to the hospital to get checked immediately.  HOME CARE INSTRUCTIONS    Avoid all  smoking, herbs, alcohol, and unprescribed drugs. These chemicals affect the formation and growth of the baby.   Follow your caregiver's instructions regarding medicine use. There are medicines that are either safe or unsafe to take during pregnancy.   Exercise only as directed by your caregiver. Experiencing uterine cramps is a good sign to stop exercising.   Continue to eat regular, healthy meals.   Wear a good support bra for breast tenderness.   Do not use hot tubs, steam rooms, or saunas.   Wear your seat belt at all times when driving.   Avoid raw meat, uncooked cheese, cat litter boxes, and soil used by cats. These carry germs that can cause birth defects in the baby.   Take your prenatal vitamins.   Try taking a stool softener (if your caregiver approves) if you develop constipation. Eat more high-fiber foods, such as fresh vegetables or fruit and whole grains. Drink plenty of fluids to keep your urine clear or pale yellow.   Take warm sitz baths to soothe any pain or discomfort caused by hemorrhoids. Use hemorrhoid cream if your caregiver approves.   If you develop varicose veins, wear support hose. Elevate your feet for 15 minutes, 3 4 times a day. Limit salt in your diet.   Avoid heavy lifting, wear low heal shoes, and practice good posture.   Rest a lot with your legs elevated if you have leg cramps or low back pain.   Visit your dentist if you have not gone during your pregnancy. Use a soft toothbrush to brush your teeth and be gentle when you floss.   A sexual relationship may be continued unless your caregiver directs you otherwise.   Do not travel far distances unless it is absolutely necessary and only with the approval of your caregiver.   Take prenatal classes to understand, practice, and ask questions about the labor and delivery.   Make a trial run to the hospital.   Pack your hospital bag.   Prepare the baby's nursery.   Continue to go to all your prenatal visits as directed  by your caregiver.  SEEK MEDICAL CARE IF:   You are unsure if you are in labor or if your water has broken.   You have dizziness.   You have mild pelvic cramps, pelvic pressure, or nagging pain in your abdominal area.   You have persistent nausea, vomiting, or diarrhea.   You have a bad smelling vaginal discharge.   You have pain with urination.  SEEK IMMEDIATE MEDICAL CARE IF:    You have a fever.   You are leaking fluid from your vagina.   You have spotting or bleeding from your vagina.     You have severe abdominal cramping or pain.   You have rapid weight loss or gain.   You have shortness of breath with chest pain.   You notice sudden or extreme swelling of your face, hands, ankles, feet, or legs.   You have not felt your baby move in over an hour.   You have severe headaches that do not go away with medicine.   You have vision changes.  Document Released: 04/09/2001 Document Revised: 12/16/2012 Document Reviewed: 06/16/2012  ExitCare Patient Information 2014 [employer].

## 2013-08-25 ENCOUNTER — Ambulatory Visit (INDEPENDENT_AMBULATORY_CARE_PROVIDER_SITE_OTHER): Payer: BC Managed Care – PPO | Admitting: Family

## 2013-08-25 VITALS — BP 117/72 | HR 100 | Temp 96.6°F | Wt 190.1 lb

## 2013-08-25 DIAGNOSIS — Z3400 Encounter for supervision of normal first pregnancy, unspecified trimester: Secondary | ICD-10-CM

## 2013-08-25 LAB — OB RESULTS CONSOLE GC/CHLAMYDIA
Chlamydia: NEGATIVE
GC PROBE AMP, GENITAL: NEGATIVE

## 2013-08-25 LAB — POCT URINALYSIS DIP (DEVICE)
Bilirubin Urine: NEGATIVE
GLUCOSE, UA: NEGATIVE mg/dL
Hgb urine dipstick: NEGATIVE
Ketones, ur: NEGATIVE mg/dL
LEUKOCYTES UA: NEGATIVE
NITRITE: NEGATIVE
Protein, ur: NEGATIVE mg/dL
Specific Gravity, Urine: 1.01 (ref 1.005–1.030)
UROBILINOGEN UA: 0.2 mg/dL (ref 0.0–1.0)
pH: 7 (ref 5.0–8.0)

## 2013-08-25 LAB — OB RESULTS CONSOLE GBS: STREP GROUP B AG: POSITIVE

## 2013-08-25 NOTE — Progress Notes (Signed)
Pt. Declines Tdap vaccine; states she had it in August.

## 2013-08-25 NOTE — Progress Notes (Signed)
Doing well.  Signed waterbirth and research consent today and reviewed contraindications if developed later in pregnancy including the following:  Preterm birth less than 37 weeks, thick, particulate meconium stained fluid, Maternal fever over 101, heavy bleeding or signs of placental abruption, pre-eclampsia, abnormal fetal heart rate pattern, breech presentation, very large baby, active communicable infection (this does NOT include group B strep), significant limitation to mobility, and other indications indicated by provider.  Purchased own tub.  Attended class on 08/11/13.  GBS and GC/CT today.

## 2013-08-26 LAB — GC/CHLAMYDIA PROBE AMP
CT PROBE, AMP APTIMA: NEGATIVE
GC Probe RNA: NEGATIVE

## 2013-08-28 ENCOUNTER — Encounter: Payer: Self-pay | Admitting: Family

## 2013-08-30 LAB — CULTURE, STREPTOCOCCUS GRP B W/SUSCEPT

## 2013-08-31 ENCOUNTER — Encounter: Payer: Self-pay | Admitting: *Deleted

## 2013-09-01 ENCOUNTER — Encounter: Payer: Self-pay | Admitting: Obstetrics & Gynecology

## 2013-09-01 ENCOUNTER — Ambulatory Visit (INDEPENDENT_AMBULATORY_CARE_PROVIDER_SITE_OTHER): Payer: BC Managed Care – PPO | Admitting: Obstetrics & Gynecology

## 2013-09-01 VITALS — BP 99/66 | HR 87 | Temp 98.0°F | Wt 193.7 lb

## 2013-09-01 DIAGNOSIS — Z3400 Encounter for supervision of normal first pregnancy, unspecified trimester: Secondary | ICD-10-CM

## 2013-09-01 NOTE — Progress Notes (Signed)
Routine visit. Good FM. No problems. Labor precautions reviewed. 

## 2013-09-07 ENCOUNTER — Encounter: Payer: Self-pay | Admitting: Advanced Practice Midwife

## 2013-09-07 ENCOUNTER — Ambulatory Visit (INDEPENDENT_AMBULATORY_CARE_PROVIDER_SITE_OTHER): Payer: BC Managed Care – PPO | Admitting: Advanced Practice Midwife

## 2013-09-07 VITALS — BP 128/72 | HR 84 | Temp 96.9°F | Wt 192.8 lb

## 2013-09-07 DIAGNOSIS — B951 Streptococcus, group B, as the cause of diseases classified elsewhere: Secondary | ICD-10-CM | POA: Insufficient documentation

## 2013-09-07 DIAGNOSIS — O98819 Other maternal infectious and parasitic diseases complicating pregnancy, unspecified trimester: Secondary | ICD-10-CM

## 2013-09-07 DIAGNOSIS — Z3400 Encounter for supervision of normal first pregnancy, unspecified trimester: Secondary | ICD-10-CM

## 2013-09-07 DIAGNOSIS — IMO0002 Reserved for concepts with insufficient information to code with codable children: Secondary | ICD-10-CM

## 2013-09-07 LAB — POCT URINALYSIS DIP (DEVICE)
Bilirubin Urine: NEGATIVE
Glucose, UA: NEGATIVE mg/dL
Hgb urine dipstick: NEGATIVE
KETONES UR: 40 mg/dL — AB
LEUKOCYTES UA: NEGATIVE
NITRITE: NEGATIVE
PH: 7 (ref 5.0–8.0)
PROTEIN: NEGATIVE mg/dL
Specific Gravity, Urine: 1.015 (ref 1.005–1.030)
UROBILINOGEN UA: 0.2 mg/dL (ref 0.0–1.0)

## 2013-09-07 NOTE — Progress Notes (Signed)
Pressure-lower abd/pelvic; into inside of leg  Edema-feet

## 2013-09-07 NOTE — Progress Notes (Signed)
Discussed signs of labor. Added to waterbirth list.

## 2013-09-07 NOTE — Patient Instructions (Signed)

## 2013-09-17 ENCOUNTER — Ambulatory Visit (INDEPENDENT_AMBULATORY_CARE_PROVIDER_SITE_OTHER): Payer: BC Managed Care – PPO | Admitting: Obstetrics and Gynecology

## 2013-09-17 ENCOUNTER — Encounter: Payer: Self-pay | Admitting: Obstetrics and Gynecology

## 2013-09-17 VITALS — BP 136/79 | HR 91 | Temp 97.5°F | Wt 196.2 lb

## 2013-09-17 DIAGNOSIS — Z3400 Encounter for supervision of normal first pregnancy, unspecified trimester: Secondary | ICD-10-CM

## 2013-09-17 LAB — POCT URINALYSIS DIP (DEVICE)
Bilirubin Urine: NEGATIVE
Glucose, UA: NEGATIVE mg/dL
Hgb urine dipstick: NEGATIVE
Ketones, ur: NEGATIVE mg/dL
Leukocytes, UA: NEGATIVE
NITRITE: NEGATIVE
PROTEIN: NEGATIVE mg/dL
Specific Gravity, Urine: 1.02 (ref 1.005–1.030)
UROBILINOGEN UA: 0.2 mg/dL (ref 0.0–1.0)
pH: 7 (ref 5.0–8.0)

## 2013-09-17 NOTE — Patient Instructions (Signed)
Fetal Movement Counts Patient Name: __________________________________________________ Patient Due Date: ____________________ Performing a fetal movement count is highly recommended in high-risk pregnancies, but it is good for every pregnant woman to do. Your caregiver may ask you to start counting fetal movements at 28 weeks of the pregnancy. Fetal movements often increase:  After eating a full meal.  After physical activity.  After eating or drinking something sweet or cold.  At rest. Pay attention to when you feel the baby is most active. This will help you notice a pattern of your baby's sleep and wake cycles and what factors contribute to an increase in fetal movement. It is important to perform a fetal movement count at the same time each day when your baby is normally most active.  HOW TO COUNT FETAL MOVEMENTS 1. Find a quiet and comfortable area to sit or lie down on your left side. Lying on your left side provides the best blood and oxygen circulation to your baby. 2. Write down the day and time on a sheet of paper or in a journal. 3. Start counting kicks, flutters, swishes, rolls, or jabs in a 2 hour period. You should feel at least 10 movements within 2 hours. 4. If you do not feel 10 movements in 2 hours, wait 2 3 hours and count again. Look for a change in the pattern or not enough counts in 2 hours. SEEK MEDICAL CARE IF:  You feel less than 10 counts in 2 hours, tried twice.  There is no movement in over an hour.  The pattern is changing or taking longer each day to reach 10 counts in 2 hours.  You feel the baby is not moving as he or she usually does. Date: ____________ Movements: ____________ Start time: ____________ Finish time: ____________  Date: ____________ Movements: ____________ Start time: ____________ Finish time: ____________ Date: ____________ Movements: ____________ Start time: ____________ Finish time: ____________ Date: ____________ Movements: ____________  Start time: ____________ Finish time: ____________ Date: ____________ Movements: ____________ Start time: ____________ Finish time: ____________ Date: ____________ Movements: ____________ Start time: ____________ Finish time: ____________ Date: ____________ Movements: ____________ Start time: ____________ Finish time: ____________ Date: ____________ Movements: ____________ Start time: ____________ Finish time: ____________  Date: ____________ Movements: ____________ Start time: ____________ Finish time: ____________ Date: ____________ Movements: ____________ Start time: ____________ Finish time: ____________ Date: ____________ Movements: ____________ Start time: ____________ Finish time: ____________ Date: ____________ Movements: ____________ Start time: ____________ Finish time: ____________ Date: ____________ Movements: ____________ Start time: ____________ Finish time: ____________ Date: ____________ Movements: ____________ Start time: ____________ Finish time: ____________ Date: ____________ Movements: ____________ Start time: ____________ Finish time: ____________  Date: ____________ Movements: ____________ Start time: ____________ Finish time: ____________ Date: ____________ Movements: ____________ Start time: ____________ Finish time: ____________ Date: ____________ Movements: ____________ Start time: ____________ Finish time: ____________ Date: ____________ Movements: ____________ Start time: ____________ Finish time: ____________ Date: ____________ Movements: ____________ Start time: ____________ Finish time: ____________ Date: ____________ Movements: ____________ Start time: ____________ Finish time: ____________ Date: ____________ Movements: ____________ Start time: ____________ Finish time: ____________  Date: ____________ Movements: ____________ Start time: ____________ Finish time: ____________ Date: ____________ Movements: ____________ Start time: ____________ Finish time:  ____________ Date: ____________ Movements: ____________ Start time: ____________ Finish time: ____________ Date: ____________ Movements: ____________ Start time: ____________ Finish time: ____________ Date: ____________ Movements: ____________ Start time: ____________ Finish time: ____________ Date: ____________ Movements: ____________ Start time: ____________ Finish time: ____________ Date: ____________ Movements: ____________ Start time: ____________ Finish time: ____________  Date: ____________ Movements: ____________ Start time: ____________ Finish   time: ____________ Date: ____________ Movements: ____________ Start time: ____________ Finish time: ____________ Date: ____________ Movements: ____________ Start time: ____________ Finish time: ____________ Date: ____________ Movements: ____________ Start time: ____________ Finish time: ____________ Date: ____________ Movements: ____________ Start time: ____________ Finish time: ____________ Date: ____________ Movements: ____________ Start time: ____________ Finish time: ____________ Date: ____________ Movements: ____________ Start time: ____________ Finish time: ____________  Date: ____________ Movements: ____________ Start time: ____________ Finish time: ____________ Date: ____________ Movements: ____________ Start time: ____________ Finish time: ____________ Date: ____________ Movements: ____________ Start time: ____________ Finish time: ____________ Date: ____________ Movements: ____________ Start time: ____________ Finish time: ____________ Date: ____________ Movements: ____________ Start time: ____________ Finish time: ____________ Date: ____________ Movements: ____________ Start time: ____________ Finish time: ____________ Date: ____________ Movements: ____________ Start time: ____________ Finish time: ____________  Date: ____________ Movements: ____________ Start time: ____________ Finish time: ____________ Date: ____________ Movements:  ____________ Start time: ____________ Finish time: ____________ Date: ____________ Movements: ____________ Start time: ____________ Finish time: ____________ Date: ____________ Movements: ____________ Start time: ____________ Finish time: ____________ Date: ____________ Movements: ____________ Start time: ____________ Finish time: ____________ Date: ____________ Movements: ____________ Start time: ____________ Finish time: ____________ Date: ____________ Movements: ____________ Start time: ____________ Finish time: ____________  Date: ____________ Movements: ____________ Start time: ____________ Finish time: ____________ Date: ____________ Movements: ____________ Start time: ____________ Finish time: ____________ Date: ____________ Movements: ____________ Start time: ____________ Finish time: ____________ Date: ____________ Movements: ____________ Start time: ____________ Finish time: ____________ Date: ____________ Movements: ____________ Start time: ____________ Finish time: ____________ Date: ____________ Movements: ____________ Start time: ____________ Finish time: ____________ Document Released: 05/15/2006 Document Revised: 04/01/2012 Document Reviewed: 02/10/2012 Exit[employer] Patient Information 2014 [employer].  

## 2013-09-17 NOTE — Progress Notes (Signed)
Doing well. All set for waterbirth. Lives 45 min away. Explained GBS pos and length of stay.

## 2013-09-17 NOTE — Progress Notes (Signed)
Edema-feet  Pressure-btwn thighs/ pelvic

## 2013-09-24 ENCOUNTER — Ambulatory Visit (INDEPENDENT_AMBULATORY_CARE_PROVIDER_SITE_OTHER): Payer: BC Managed Care – PPO | Admitting: Family

## 2013-09-24 VITALS — BP 116/79 | HR 83 | Temp 96.0°F | Wt 194.7 lb

## 2013-09-24 DIAGNOSIS — O480 Post-term pregnancy: Secondary | ICD-10-CM

## 2013-09-24 DIAGNOSIS — O98819 Other maternal infectious and parasitic diseases complicating pregnancy, unspecified trimester: Principal | ICD-10-CM

## 2013-09-24 DIAGNOSIS — Z3400 Encounter for supervision of normal first pregnancy, unspecified trimester: Secondary | ICD-10-CM

## 2013-09-24 DIAGNOSIS — IMO0002 Reserved for concepts with insufficient information to code with codable children: Secondary | ICD-10-CM

## 2013-09-24 DIAGNOSIS — B951 Streptococcus, group B, as the cause of diseases classified elsewhere: Secondary | ICD-10-CM

## 2013-09-24 LAB — POCT URINALYSIS DIP (DEVICE)
Bilirubin Urine: NEGATIVE
GLUCOSE, UA: NEGATIVE mg/dL
HGB URINE DIPSTICK: NEGATIVE
Ketones, ur: NEGATIVE mg/dL
Leukocytes, UA: NEGATIVE
NITRITE: NEGATIVE
Protein, ur: 30 mg/dL — AB
Specific Gravity, Urine: 1.015 (ref 1.005–1.030)
UROBILINOGEN UA: 0.2 mg/dL (ref 0.0–1.0)
pH: 7 (ref 5.0–8.0)

## 2013-09-24 NOTE — Progress Notes (Signed)
Reports having increased cramping and desire to have sex.  Denies bleeding or leaking of fluid.  Reviewed signs of labor.  Discussed induction of labor at 41 wks, would like to go as far past due date as possible. NST - reactive, return on Tuesday for NST and next Friday for NST and appt.

## 2013-09-24 NOTE — Progress Notes (Signed)
C/o Edema in hands and feet. Pelvic pressure and irregular contractions.

## 2013-09-28 ENCOUNTER — Ambulatory Visit (INDEPENDENT_AMBULATORY_CARE_PROVIDER_SITE_OTHER): Payer: BC Managed Care – PPO | Admitting: *Deleted

## 2013-09-28 VITALS — BP 128/72 | HR 105

## 2013-09-28 DIAGNOSIS — O480 Post-term pregnancy: Secondary | ICD-10-CM

## 2013-09-28 NOTE — Progress Notes (Signed)
NST 09/28/13 reactive

## 2013-10-01 ENCOUNTER — Encounter: Payer: Self-pay | Admitting: Obstetrics and Gynecology

## 2013-10-01 ENCOUNTER — Ambulatory Visit (INDEPENDENT_AMBULATORY_CARE_PROVIDER_SITE_OTHER): Payer: BC Managed Care – PPO | Admitting: Obstetrics and Gynecology

## 2013-10-01 ENCOUNTER — Ambulatory Visit (HOSPITAL_COMMUNITY)
Admission: RE | Admit: 2013-10-01 | Discharge: 2013-10-01 | Disposition: A | Discharge status: Home / Self Care | Payer: BC Managed Care – PPO | Source: Ambulatory Visit | Attending: Obstetrics and Gynecology | Admitting: Obstetrics and Gynecology

## 2013-10-01 VITALS — BP 124/79 | HR 90 | Temp 97.1°F | Wt 195.1 lb

## 2013-10-01 DIAGNOSIS — Z3400 Encounter for supervision of normal first pregnancy, unspecified trimester: Secondary | ICD-10-CM

## 2013-10-01 DIAGNOSIS — Z22330 Carrier of Group B streptococcus: Secondary | ICD-10-CM

## 2013-10-01 DIAGNOSIS — O480 Post-term pregnancy: Secondary | ICD-10-CM

## 2013-10-01 DIAGNOSIS — O4290 Premature rupture of membranes, unspecified as to length of time between rupture and onset of labor, unspecified weeks of gestation: Principal | ICD-10-CM | POA: Diagnosis present

## 2013-10-01 DIAGNOSIS — O99892 Other specified diseases and conditions complicating childbirth: Secondary | ICD-10-CM | POA: Diagnosis present

## 2013-10-01 DIAGNOSIS — Z3689 Encounter for other specified antenatal screening: Secondary | ICD-10-CM

## 2013-10-01 DIAGNOSIS — Z8759 Personal history of other complications of pregnancy, childbirth and the puerperium: Secondary | ICD-10-CM

## 2013-10-01 DIAGNOSIS — Z8742 Personal history of other diseases of the female genital tract: Secondary | ICD-10-CM

## 2013-10-01 DIAGNOSIS — O9989 Other specified diseases and conditions complicating pregnancy, childbirth and the puerperium: Secondary | ICD-10-CM

## 2013-10-01 LAB — POCT URINALYSIS DIP (DEVICE)
Bilirubin Urine: NEGATIVE
Glucose, UA: NEGATIVE mg/dL
Hgb urine dipstick: NEGATIVE
Ketones, ur: NEGATIVE mg/dL
Leukocytes, UA: NEGATIVE
NITRITE: NEGATIVE
PH: 6.5 (ref 5.0–8.0)
Protein, ur: NEGATIVE mg/dL
Specific Gravity, Urine: 1.01 (ref 1.005–1.030)
Urobilinogen, UA: 0.2 mg/dL (ref 0.0–1.0)

## 2013-10-01 NOTE — Patient Instructions (Signed)
Fetal Movement Counts Patient Name: __________________________________________________ Patient Due Date: ____________________ Performing a fetal movement count is highly recommended in high-risk pregnancies, but it is good for every pregnant woman to do. Your caregiver may ask you to start counting fetal movements at 28 weeks of the pregnancy. Fetal movements often increase:  After eating a full meal.  After physical activity.  After eating or drinking something sweet or cold.  At rest. Pay attention to when you feel the baby is most active. This will help you notice a pattern of your baby's sleep and wake cycles and what factors contribute to an increase in fetal movement. It is important to perform a fetal movement count at the same time each day when your baby is normally most active.  HOW TO COUNT FETAL MOVEMENTS 1. Find a quiet and comfortable area to sit or lie down on your left side. Lying on your left side provides the best blood and oxygen circulation to your baby. 2. Write down the day and time on a sheet of paper or in a journal. 3. Start counting kicks, flutters, swishes, rolls, or jabs in a 2 hour period. You should feel at least 10 movements within 2 hours. 4. If you do not feel 10 movements in 2 hours, wait 2 3 hours and count again. Look for a change in the pattern or not enough counts in 2 hours. SEEK MEDICAL CARE IF:  You feel less than 10 counts in 2 hours, tried twice.  There is no movement in over an hour.  The pattern is changing or taking longer each day to reach 10 counts in 2 hours.  You feel the baby is not moving as he or she usually does. Date: ____________ Movements: ____________ Start time: ____________ Finish time: ____________  Date: ____________ Movements: ____________ Start time: ____________ Finish time: ____________ Date: ____________ Movements: ____________ Start time: ____________ Finish time: ____________ Date: ____________ Movements: ____________  Start time: ____________ Finish time: ____________ Date: ____________ Movements: ____________ Start time: ____________ Finish time: ____________ Date: ____________ Movements: ____________ Start time: ____________ Finish time: ____________ Date: ____________ Movements: ____________ Start time: ____________ Finish time: ____________ Date: ____________ Movements: ____________ Start time: ____________ Finish time: ____________  Date: ____________ Movements: ____________ Start time: ____________ Finish time: ____________ Date: ____________ Movements: ____________ Start time: ____________ Finish time: ____________ Date: ____________ Movements: ____________ Start time: ____________ Finish time: ____________ Date: ____________ Movements: ____________ Start time: ____________ Finish time: ____________ Date: ____________ Movements: ____________ Start time: ____________ Finish time: ____________ Date: ____________ Movements: ____________ Start time: ____________ Finish time: ____________ Date: ____________ Movements: ____________ Start time: ____________ Finish time: ____________  Date: ____________ Movements: ____________ Start time: ____________ Finish time: ____________ Date: ____________ Movements: ____________ Start time: ____________ Finish time: ____________ Date: ____________ Movements: ____________ Start time: ____________ Finish time: ____________ Date: ____________ Movements: ____________ Start time: ____________ Finish time: ____________ Date: ____________ Movements: ____________ Start time: ____________ Finish time: ____________ Date: ____________ Movements: ____________ Start time: ____________ Finish time: ____________ Date: ____________ Movements: ____________ Start time: ____________ Finish time: ____________  Date: ____________ Movements: ____________ Start time: ____________ Finish time: ____________ Date: ____________ Movements: ____________ Start time: ____________ Finish time:  ____________ Date: ____________ Movements: ____________ Start time: ____________ Finish time: ____________ Date: ____________ Movements: ____________ Start time: ____________ Finish time: ____________ Date: ____________ Movements: ____________ Start time: ____________ Finish time: ____________ Date: ____________ Movements: ____________ Start time: ____________ Finish time: ____________ Date: ____________ Movements: ____________ Start time: ____________ Finish time: ____________  Date: ____________ Movements: ____________ Start time: ____________ Finish   time: ____________ Date: ____________ Movements: ____________ Start time: ____________ Finish time: ____________ Date: ____________ Movements: ____________ Start time: ____________ Finish time: ____________ Date: ____________ Movements: ____________ Start time: ____________ Finish time: ____________ Date: ____________ Movements: ____________ Start time: ____________ Finish time: ____________ Date: ____________ Movements: ____________ Start time: ____________ Finish time: ____________ Date: ____________ Movements: ____________ Start time: ____________ Finish time: ____________  Date: ____________ Movements: ____________ Start time: ____________ Finish time: ____________ Date: ____________ Movements: ____________ Start time: ____________ Finish time: ____________ Date: ____________ Movements: ____________ Start time: ____________ Finish time: ____________ Date: ____________ Movements: ____________ Start time: ____________ Finish time: ____________ Date: ____________ Movements: ____________ Start time: ____________ Finish time: ____________ Date: ____________ Movements: ____________ Start time: ____________ Finish time: ____________ Date: ____________ Movements: ____________ Start time: ____________ Finish time: ____________  Date: ____________ Movements: ____________ Start time: ____________ Finish time: ____________ Date: ____________ Movements:  ____________ Start time: ____________ Finish time: ____________ Date: ____________ Movements: ____________ Start time: ____________ Finish time: ____________ Date: ____________ Movements: ____________ Start time: ____________ Finish time: ____________ Date: ____________ Movements: ____________ Start time: ____________ Finish time: ____________ Date: ____________ Movements: ____________ Start time: ____________ Finish time: ____________ Date: ____________ Movements: ____________ Start time: ____________ Finish time: ____________  Date: ____________ Movements: ____________ Start time: ____________ Finish time: ____________ Date: ____________ Movements: ____________ Start time: ____________ Finish time: ____________ Date: ____________ Movements: ____________ Start time: ____________ Finish time: ____________ Date: ____________ Movements: ____________ Start time: ____________ Finish time: ____________ Date: ____________ Movements: ____________ Start time: ____________ Finish time: ____________ Date: ____________ Movements: ____________ Start time: ____________ Finish time: ____________ Document Released: 05/15/2006 Document Revised: 04/01/2012 Document Reviewed: 02/10/2012 Exit[employer] Patient Information 2014 [employer].  

## 2013-10-01 NOTE — Progress Notes (Signed)
Postdates [redacted]w[redacted]d by [place]. Reviewed dating criteria: cycle length 34-38 days for 4 cycles prior to LMP. Initial scan at 18wks, so margin of error about 7days. Good FM. Has not had AFI. Since prefers not to be induced and BEGA probably is 40.3 wks, will get AFI. Offered IOL Sun. or continued FATs and she wants to defer IOL if all parameters normal today and will discuss with her partner. If defers, will do FM counts and return for NST in 3 days. Passed mucus plug and brown discharge after last VE. Cx soft, post. No blood noted.  Has everything ready for waterbirth. FM awareness and s/sx labor reviewed.

## 2013-10-02 ENCOUNTER — Encounter (HOSPITAL_COMMUNITY): Payer: Self-pay | Admitting: *Deleted

## 2013-10-02 ENCOUNTER — Inpatient Hospital Stay (HOSPITAL_COMMUNITY)
Admission: AD | Admit: 2013-10-02 | Discharge: 2013-10-05 | DRG: 775 | Disposition: A | Discharge status: Home / Self Care | Payer: BC Managed Care – PPO | Source: Ambulatory Visit | Attending: Obstetrics & Gynecology | Admitting: Obstetrics & Gynecology

## 2013-10-02 DIAGNOSIS — IMO0001 Reserved for inherently not codable concepts without codable children: Secondary | ICD-10-CM

## 2013-10-02 LAB — CBC
HCT: 35.5 % — ABNORMAL LOW (ref 36.0–46.0)
Hemoglobin: 12.5 g/dL (ref 12.0–15.0)
MCH: 31.7 pg (ref 26.0–34.0)
MCHC: 35.2 g/dL (ref 30.0–36.0)
MCV: 90.1 fL (ref 78.0–100.0)
Platelets: 137 10*3/uL — ABNORMAL LOW (ref 150–400)
RBC: 3.94 MIL/uL (ref 3.87–5.11)
RDW: 12.7 % (ref 11.5–15.5)
WBC: 7.8 10*3/uL (ref 4.0–10.5)

## 2013-10-02 LAB — RPR

## 2013-10-02 MED ORDER — PENICILLIN G POTASSIUM 5000000 UNITS IJ SOLR
5.0000 10*6.[IU] | Freq: Once | INTRAMUSCULAR | Status: AC
  Administered 2013-10-02: 5 10*6.[IU] via INTRAVENOUS
  Filled 2013-10-02: qty 5

## 2013-10-02 MED ORDER — FLEET ENEMA 7-19 GM/118ML RE ENEM: 1.0000 | ENEMA | RECTAL | Status: DC | PRN

## 2013-10-02 MED ORDER — ONDANSETRON HCL 4 MG/2ML IJ SOLN
4.0000 mg | Freq: Four times a day (QID) | INTRAMUSCULAR | Status: DC | PRN
  Administered 2013-10-03: 4 mg via INTRAVENOUS
  Filled 2013-10-02: qty 2

## 2013-10-02 MED ORDER — LACTATED RINGERS IV SOLN
500.0000 mL | INTRAVENOUS | Status: DC | PRN
  Administered 2013-10-03: 500 mL via INTRAVENOUS

## 2013-10-02 MED ORDER — OXYCODONE-ACETAMINOPHEN 5-325 MG PO TABS: 1.0000 | ORAL_TABLET | ORAL | Status: DC | PRN

## 2013-10-02 MED ORDER — ACETAMINOPHEN 325 MG PO TABS: 650.0000 mg | ORAL_TABLET | ORAL | Status: DC | PRN

## 2013-10-02 MED ORDER — OXYTOCIN 40 UNITS IN LACTATED RINGERS INFUSION - SIMPLE MED
62.5000 mL/h | INTRAVENOUS | Status: DC
  Filled 2013-10-02: qty 1000

## 2013-10-02 MED ORDER — IBUPROFEN 600 MG PO TABS: 600.0000 mg | ORAL_TABLET | Freq: Four times a day (QID) | ORAL | Status: DC | PRN

## 2013-10-02 MED ORDER — OXYTOCIN BOLUS FROM INFUSION: 500.0000 mL | INTRAVENOUS | Status: DC

## 2013-10-02 MED ORDER — CITRIC ACID-SODIUM CITRATE 334-500 MG/5ML PO SOLN
30.0000 mL | ORAL | Status: DC | PRN
  Administered 2013-10-03: 30 mL via ORAL
  Filled 2013-10-02: qty 15

## 2013-10-02 MED ORDER — LACTATED RINGERS IV SOLN
INTRAVENOUS | Status: DC
Start: 2013-10-02 — End: 2013-10-03
  Administered 2013-10-02 – 2013-10-03 (×2): via INTRAVENOUS

## 2013-10-02 MED ORDER — LIDOCAINE HCL (PF) 1 % IJ SOLN
30.0000 mL | INTRAMUSCULAR | Status: AC | PRN
  Administered 2013-10-03: 30 mL via SUBCUTANEOUS
  Filled 2013-10-02: qty 30

## 2013-10-02 MED ORDER — PENICILLIN G POTASSIUM 5000000 UNITS IJ SOLR
2.5000 10*6.[IU] | INTRAVENOUS | Status: DC
  Administered 2013-10-02 – 2013-10-03 (×5): 2.5 10*6.[IU] via INTRAVENOUS
  Filled 2013-10-02 (×8): qty 2.5

## 2013-10-02 NOTE — Progress Notes (Signed)
Danielle Rubio is a 28 y.o. G1P0 at [redacted]w[redacted]d admitted for PROM  Subjective: No ctx. Feeling comfortable. Undecided on when she wants pitocin to start. +FM  Objective: BP 125/79  Pulse 89  Temp(Src) 97.9 F (36.6 C) (Oral)  Resp 18  Ht 5\' 6"  (1.676 m)  Wt 87.998 kg (194 lb)  BMI 31.33 kg/m2  LMP 12/15/2012      FHT:  FHR: 125 bpm, variability: moderate,  accelerations:  Present,  decelerations:  Absent UC:   none SVE:   Dilation: 3 Effacement (%): 70 Station: -2 Exam by:: Quintella Baton RNC  Labs: Lab Results  Component Value Date   WBC 7.8 10/02/2013   HGB 12.5 10/02/2013   HCT 35.5* 10/02/2013   MCV 90.1 10/02/2013   PLT 137* 10/02/2013    Assessment / Plan: PROM, awaiting spontaneous onset of labor  Labor: discussed that will decide to start pitocin prior to checking a cervix.  No reason to ictroduce infection. pt wanting to talk to her husband and then will let us know when she wants pitocin started.  Fetal Wellbeing:  Category I Pain Control:  Labor support without medications I/D:  gbs pos. on PCN Anticipated MOD:  NSVD  Shivani Barrantes L Naryiah Schley 10/02/2013, 8:41 PM

## 2013-10-02 NOTE — Progress Notes (Signed)
Dr Schmitz in to see pt 

## 2013-10-02 NOTE — H&P (Signed)
LABOR ADMISSION HISTORY AND PHYSICAL  Danielle Rubio is a 28 y.o. female G1P0 with IUP at [redacted]w[redacted]d presenting for onset of labor.  She reports a large gush of water leakage this morning at 3 am with continued leakage. She notes having contractions with regularity and consistency. She's had associated back pain.  Pregnancy has been uncomplicated.  [place] on 6/5 showing normal AFI, no previa, and fetus in cephalic position.   [facility] 14 wks  Prenatal History/Complications:  Past Medical History: Past Medical History  Diagnosis Date  . Medical history non-contributory     Past Surgical History: Past Surgical History  Procedure Laterality Date  . Wisdom tooth extraction    . No past surgeries      Obstetrical History: OB History   Grav Para Term Preterm Abortions TAB SAB Ect Mult Living   1               Social History: History   Social History  . Marital Status: Married    Spouse Name: N/A    Number of Children: N/A  . Years of Education: N/A   Social History Main Topics  . Smoking status: Never Smoker   . Smokeless tobacco: None  . Alcohol Use: No  . Drug Use: No  . Sexual Activity: Yes    Birth Control/ Protection: None   Other Topics Concern  . None   Social History Narrative  . None    Family History: Family History  Problem Relation Age of Onset  . Cancer Maternal Aunt     bladder cancer  . Alzheimer's disease Maternal Grandmother   . Cancer Maternal Grandfather     pancreatic    Allergies: Allergies  Allergen Reactions  . Gluten Meal Other (See Comments)    GI effects    Facility-administered medications prior to admission  Medication Dose Route Frequency Provider Last Rate Last Dose  . Tdap (BOOSTRIX) injection 0.5 mL  0.5 mL Intramuscular Once Minta Balsam, MD       Prescriptions prior to admission  Medication Sig Dispense Refill  . Prenatal Vit-Fe Fumarate-FA (MULTIVITAMIN-PRENATAL) 27-0.8 MG TABS tablet Take 1 tablet by  mouth daily at 12 noon.         Review of Systems   All systems reviewed and negative except as stated in HPI  Blood pressure 139/92, pulse 127, temperature 98.4 F (36.9 C), resp. rate 20, height 5\' 6"  (1.676 m), weight 88.179 kg (194 lb 6.4 oz), last menstrual period 12/15/2012. General appearance: alert, cooperative and no distress Heart: regular rate and rhythm Abdomen: soft, non-tender; bowel sounds normal Extremities: Homans sign is negative, no sign of DVT Presentation: cephalic Fetal monitoringBaseline: 125 bpm, Variability: Good {> 6 bpm), Accelerations: Reactive and Decelerations: Absent Uterine activityFrequency: Every 3-4 minutes Dilation: 3 Effacement (%): 70 Station: -2 Exam by:: Quintella Baton RNC   Prenatal labs: ABO, Rh: A/POS/-- (11/26 0940) Antibody: NEG (11/26 0940) Rubella:   RPR: NON REAC (02/24 1614)  HBsAg: NEGATIVE (11/26 0940)  HIV: NON REACTIVE (02/24 1614)  GBS: Positive (04/29 0000)  1 hr Glucola 124 Genetic screening  Declined  Anatomy US normal   Prenatal Transfer Tool  Maternal Diabetes: No Genetic Screening: Declined Maternal Ultrasounds/Referrals: Normal Fetal Ultrasounds or other Referrals:  None Maternal Substance Abuse:  No Significant Maternal Medications:  None Significant Maternal Lab Results: Lab values include: Group B Strep positive  Clinic  [facility]; Desires waterbirth; class 08/11/13, consent x 2 on 08/25/13;  bought tub  Genetic Screen  Declined  Anatomic US  Normal @ 18 wks  Glucose Screen 124  GBS  Pos  Feeding Preference  breast  Contraception  condoms  Circumcision  female  Pap  Neg; GC/CT neg x 2  UDS neg    Results for orders placed in visit on 10/01/13 (from the past 24 hour(s))  POCT URINALYSIS DIP (DEVICE)   Collection Time    10/01/13 10:26 AM      Result Value Ref Range   Glucose, UA NEGATIVE  NEGATIVE mg/dL   Bilirubin Urine NEGATIVE  NEGATIVE   Ketones, ur NEGATIVE  NEGATIVE mg/dL   Specific Gravity,  Urine 1.010  1.005 - 1.030   Hgb urine dipstick NEGATIVE  NEGATIVE   pH 6.5  5.0 - 8.0   Protein, ur NEGATIVE  NEGATIVE mg/dL   Urobilinogen, UA 0.2  0.0 - 1.0 mg/dL   Nitrite NEGATIVE  NEGATIVE   Leukocytes, UA NEGATIVE  NEGATIVE    Assessment: Danielle Rubio is a 28 y.o. G1P0 at 8362w4d here for onset of labor. SROM at 0300 this morning.     #Labor: desires water birth, consent signed, Purchased own tub, and Attended class on 08/11/13. #Pain: Labor without medications  #FWB: Cat 1 #ID:  GBS +, PCN #MOF: breast  #MOC:condoms  #Circ:  Female   Myra RudeJeremy E Schmitz 10/02/2013, 5:27 AM  I have seen and examined this patient and I agree with the above. Spoke with pt re using tub when in active labor. In latent phase she will try ambulating and possibly nipple stim. First dose of PCN infused.  Arabella MerlesKimberly D Ezequias Lard 9:02 AM 10/02/2013

## 2013-10-02 NOTE — Progress Notes (Addendum)
Danielle Rubio is a 28 y.o. G1P0 at [redacted]w[redacted]d admitted for PROM. Had SROM clear fluid 12 hrs ago and having irregular mild UCs. Still leaking a little. Only slept 2 hrs last night. Dating criteria reviewed yesterday since long cycle intervals 34-38 day. AFI normal yesterday.   Subjective: Comfortable, but fatigues. Desires expectantant management.   Objective: BP 131/77  Pulse 88  Temp(Src) 98.6 F (37 C) (Oral)  Resp 18  Ht 5\' 6"  (1.676 m)  Wt 87.998 kg (194 lb)  BMI 31.33 kg/m2  LMP 12/15/2012 BPs sl elevated on admission, not persitent  Fetal Heart FHR: 120 bpm, variability: moderate,  accelerations:  Present,  decelerations:  Absent Not on EFM at present  Contractions: rare mild  SVE:   Dilation: 3 Effacement (%): 70 Station: -2 Exam by:: Quintella Baton RNC earlier SVE not done  Assessment / Plan:  Labor: G1 at 40.5 (LMP) 41.4 ([place] @18  wks) PROM not in established labor. No evidence chorio. Will continue expectant management up to 24 hrs from ROM if remains stable Fetal Wellbeing: [place] 1 Pain Control:  N/A Expected mode of delivery: NSVD Plans waterbirth GBS carriage> continue IV PCN  Emorie Mcfate C Wendelin Reader 10/02/2013, 3:05 PM

## 2013-10-02 NOTE — Progress Notes (Signed)
Danielle Rubio is a 28 y.o. G1P0 at [redacted]w[redacted]d admitted for PROM  Subjective:  Not feeling any contractions or discomfort.  +FM. Still leaking clear fluid.   Objective: BP 133/77  Pulse 96  Temp(Src) 98.7 F (37.1 C) (Oral)  Resp 18  Ht 5\' 6"  (1.676 m)  Wt 87.998 kg (194 lb)  BMI 31.33 kg/m2  LMP 12/15/2012      FHT:  FHR: 120 bpm, variability: moderate,  accelerations:  Present,  decelerations:  Absent UC:   irregular, every 2-7 minutes SVE:   Dilation: 3 Effacement (%): 70 Station: -2 Exam by:: Danielle Rubio RNC  Labs: Lab Results  Component Value Date   WBC 7.8 10/02/2013   HGB 12.5 10/02/2013   HCT 35.5* 10/02/2013   MCV 90.1 10/02/2013   PLT 137* 10/02/2013    Assessment / Plan: PROM, expectant management  Labor: cont expectant management. discussed can wait up till 24 hours and then will be forced to start induction. Pt wanting to wait currently.   Fetal Wellbeing:  Category I Pain Control:  Labor support without medications I/D:  n/a Anticipated MOD:  NSVD- still planning waterbirth.   Danielle Rubio 10/02/2013, 11:30 AM

## 2013-10-02 NOTE — Progress Notes (Signed)
Dr Jordan Likes notified of pt's admission and status. Aware of contraction pattern with some variables but reactive now, sve, SROm with cl fld at 0300. GBS+ and desires water birth. Will admit to [facility]

## 2013-10-02 NOTE — Progress Notes (Signed)
Report called to [facility] in [facility]. PT to [facility] via w/c

## 2013-10-02 NOTE — MAU Note (Signed)
SROM at 0300. Mild cramping

## 2013-10-02 NOTE — Progress Notes (Signed)
Danielle Rubio is a 28 y.o. G1P0 at [redacted]w[redacted]d   Subjective: Possibly feeling ctx increase some, but is looking at the monitor screen to verify this  Objective: BP 125/79  Pulse 89  Temp(Src) 97.9 F (36.6 C) (Oral)  Resp 18  Ht 5\' 6"  (1.676 m)  Wt 87.998 kg (194 lb)  BMI 31.33 kg/m2  LMP 12/15/2012      FHT:  FHR: 130s bpm, variability: moderate,  accelerations:  Present,  decelerations:  Absent UC:   irregular, every 4-7 minutes SVE:   Dilation: 3 Effacement (%): 70 Station: -2 Exam by:: Quintella Baton RNC- exam deferred (has only had the single exam in MAU this morning)  Labs: Lab Results  Component Value Date   WBC 7.8 10/02/2013   HGB 12.5 10/02/2013   HCT 35.5* 10/02/2013   MCV 90.1 10/02/2013   PLT 137* 10/02/2013    Assessment / Plan: IUP @ 41.4 (or 40.5) PROM x 19hrs GBS +; receiving PCN  Rev'd options of starting Pitocin now or sometime prior to 0300; pt husband is resting and they plan to discuss this later when he gets up. All questions answered.  [name] 10/02/2013, 9:51 PM

## 2013-10-02 NOTE — Progress Notes (Signed)
Verbal orders given to night shift nurse by Jari Pigg for intermittent monitoring.

## 2013-10-03 ENCOUNTER — Encounter (HOSPITAL_COMMUNITY): Payer: Self-pay | Admitting: *Deleted

## 2013-10-03 DIAGNOSIS — O99892 Other specified diseases and conditions complicating childbirth: Secondary | ICD-10-CM | POA: Diagnosis not present

## 2013-10-03 DIAGNOSIS — O9989 Other specified diseases and conditions complicating pregnancy, childbirth and the puerperium: Secondary | ICD-10-CM

## 2013-10-03 DIAGNOSIS — O4290 Premature rupture of membranes, unspecified as to length of time between rupture and onset of labor, unspecified weeks of gestation: Secondary | ICD-10-CM

## 2013-10-03 MED ORDER — DIBUCAINE 1 % RE OINT: 1.0000 "application " | TOPICAL_OINTMENT | RECTAL | Status: DC | PRN

## 2013-10-03 MED ORDER — DIPHENHYDRAMINE HCL 25 MG PO CAPS: 25.0000 mg | ORAL_CAPSULE | Freq: Four times a day (QID) | ORAL | Status: DC | PRN

## 2013-10-03 MED ORDER — BENZOCAINE-MENTHOL 20-0.5 % EX AERO
1.0000 "application " | INHALATION_SPRAY | CUTANEOUS | Status: DC | PRN
  Administered 2013-10-03: 1 via TOPICAL
  Filled 2013-10-03: qty 56

## 2013-10-03 MED ORDER — IBUPROFEN 600 MG PO TABS
600.0000 mg | ORAL_TABLET | Freq: Four times a day (QID) | ORAL | Status: DC
  Filled 2013-10-03 (×5): qty 1

## 2013-10-03 MED ORDER — LANOLIN HYDROUS EX OINT: TOPICAL_OINTMENT | CUTANEOUS | Status: DC | PRN

## 2013-10-03 MED ORDER — SENNOSIDES-DOCUSATE SODIUM 8.6-50 MG PO TABS
2.0000 | ORAL_TABLET | ORAL | Status: DC
  Administered 2013-10-04: 2 via ORAL
  Filled 2013-10-03 (×2): qty 2

## 2013-10-03 MED ORDER — ONDANSETRON HCL 4 MG PO TABS
4.0000 mg | ORAL_TABLET | ORAL | Status: DC | PRN
Start: 2013-10-03 — End: 2013-10-05

## 2013-10-03 MED ORDER — PRENATAL MULTIVITAMIN CH
1.0000 | ORAL_TABLET | Freq: Every day | ORAL | Status: DC
  Filled 2013-10-03: qty 1

## 2013-10-03 MED ORDER — WITCH HAZEL-GLYCERIN EX PADS
1.0000 | MEDICATED_PAD | CUTANEOUS | Status: DC | PRN
Start: 2013-10-03 — End: 2013-10-05

## 2013-10-03 MED ORDER — ONDANSETRON HCL 4 MG/2ML IJ SOLN: 4.0000 mg | INTRAMUSCULAR | Status: DC | PRN

## 2013-10-03 MED ORDER — ZOLPIDEM TARTRATE 5 MG PO TABS
5.0000 mg | ORAL_TABLET | Freq: Every evening | ORAL | Status: DC | PRN
Start: 2013-10-03 — End: 2013-10-05

## 2013-10-03 MED ORDER — SIMETHICONE 80 MG PO CHEW: 80.0000 mg | CHEWABLE_TABLET | ORAL | Status: DC | PRN

## 2013-10-03 MED ORDER — OXYTOCIN 40 UNITS IN LACTATED RINGERS INFUSION - SIMPLE MED
1.0000 m[IU]/min | INTRAVENOUS | Status: DC
  Administered 2013-10-03: 2 m[IU]/min via INTRAVENOUS

## 2013-10-03 MED ORDER — OXYCODONE-ACETAMINOPHEN 5-325 MG PO TABS: 1.0000 | ORAL_TABLET | ORAL | Status: DC | PRN

## 2013-10-03 MED ORDER — TERBUTALINE SULFATE 1 MG/ML IJ SOLN: 0.2500 mg | Freq: Once | INTRAMUSCULAR | Status: DC | PRN

## 2013-10-03 NOTE — H&P (Signed)
Attestation of Attending Supervision of Advanced Practitioner: Evaluation and management procedures were performed by the PA/NP/CNM/OB Fellow under my supervision/collaboration. Chart reviewed and agree with management and plan.  Nikos Anglemyer V 10/03/2013 7:50 AM   

## 2013-10-03 NOTE — Progress Notes (Signed)
Danielle Rubio is a 28 y.o. G1P0 at [redacted]w[redacted]d   Subjective: Feeling ctx more intensely  Objective: BP 137/86  Pulse 111  Temp(Src) 98.6 F (37 C) (Oral)  Resp 18  Ht 5\' 6"  (1.676 m)  Wt 87.998 kg (194 lb)  BMI 31.33 kg/m2  LMP 12/15/2012      FHT:  FHR: 140s bpm, variability: moderate,  accelerations:  Present,  decelerations:  Absent UC:   irregular, every 3-6 minutes SVE:   Dilation: 3 Effacement (%): 70 Station: -2 Exam by:: Danielle Rubio, CNM  Labs: Lab Results  Component Value Date   WBC 7.8 10/02/2013   HGB 12.5 10/02/2013   HCT 35.5* 10/02/2013   MCV 90.1 10/02/2013   PLT 137* 10/02/2013    Assessment / Plan: PROM x 22hrs GBS +  Exam to confirm vtx and to determine cx readiness for Pitocin- will begin now per pt request  Danielle Rubio CNM 10/03/2013, 1:26 AM

## 2013-10-03 NOTE — Lactation Note (Signed)
This note was copied from the chart of Danielle [employer]. Lactation Consultation Note Initial visit at 12 hours of age. [facility] LC resources given and discussed.  Encouraged to feed with early cues on demand.  Early newborn behavior discussed.  Hand expression demonstrated with colostrum visible. Baby has been sleepy for the past 4 hours and mom is concerned.  Instructed on waking techniques and assisted with latch in football hold STS.  Baby is not organized with tongue, she is "snorting" sounding with tongue up to roof of mouth with rooting and latch attempt.  Suck training demonstrated and encouraged,  Baby fussy and burped 4 times during visit.  Baby did latch well with wide flanged lips for several minutes of strong sucks and few swallows heard.  Baby slips to tip of nipple and curls bottom lip after initial good latch.  Continued to work on position changes to achieve better latch.  Demonstrated cross cradle hold also.  Baby may just be gassy and not organized with feeding at this time.   Mom to call for assist as needed.    Patient Name: Danielle Rubio [id number] Date: 10/03/2013 Reason for consult: Initial assessment   Maternal Data Has patient been taught Hand Expression?: Yes Does the patient have breastfeeding experience prior to this delivery?: No  Feeding Feeding Type: Breast Fed Length of feed: 5 min (on and off for about 15 minutes)  LATCH Score/Interventions Latch: Repeated attempts needed to sustain latch, nipple held in mouth throughout feeding, stimulation needed to elicit sucking reflex. Intervention(s): Breast compression;Assist with latch;Adjust position  Audible Swallowing: A few with stimulation Intervention(s): Skin to skin;Hand expression;Alternate breast massage  Type of Nipple: Everted at rest and after stimulation  Comfort (Breast/Nipple): Soft / non-tender     Hold (Positioning): Assistance needed to correctly position infant at breast and maintain  latch. Intervention(s): Skin to skin;Position options;Support Pillows;Breastfeeding basics reviewed  LATCH Score: 7  Lactation Tools Discussed/Used     Consult Status Consult Status: Follow-up Date: 10/04/13 Follow-up type: In-patient    [name] 10/03/2013, 7:01 PM

## 2013-10-03 NOTE — Progress Notes (Signed)
0124 Pt wanted to take shower before we start pitocin. Told her strict 30 rule after exam. She verbalized understanding, husband assisting her in shower.

## 2013-10-04 ENCOUNTER — Other Ambulatory Visit: Payer: BC Managed Care – PPO

## 2013-10-04 NOTE — Lactation Note (Signed)
This note was copied from the chart of Danielle [employer]. Lactation Consultation Note  Mother states bf improving.  Baby has been eating more regular, active at the breast. Mother's nipples are starting to get sore and are pink.  Mother states ebm is helping.  Provided comfort gels. Reminded parents to flange bottom lip down and be sure latch is deep. Asked if they had been doing suck training and they felt it has not been needed since feeding has improved. Mom encouraged to feed baby 8-12 times/24 hours and with feeding cues.  Encouraged mother to call for RN or lactation to view next feeding.   Patient Name: Danielle Rubio [id number] Date: 10/04/2013 Reason for consult: Follow-up assessment   Maternal Data    Feeding Feeding Type: Breast Fed Length of feed: 15 min  LATCH Score/Interventions                      Lactation Tools Discussed/Used     Consult Status Consult Status: Follow-up Date: 10/05/13 Follow-up type: In-patient    Danielle Rubio 10/04/2013, 11:53 AM

## 2013-10-04 NOTE — Progress Notes (Signed)
Post Partum Day 1 Subjective: no complaints, up ad lib, voiding, tolerating PO and + flatus  Objective: Blood pressure 117/76, pulse 89, temperature 98 F (36.7 C), temperature source Oral, resp. rate 18, height 5\' 6"  (1.676 m), weight 87.998 kg (194 lb), last menstrual period 12/15/2012, SpO2 97.00%, unknown if currently breastfeeding.  Physical Exam:  General: alert, cooperative, fatigued and no distress Lochia: appropriate Uterine Fundus: firm Incision: healing well DVT Evaluation: No evidence of DVT seen on physical exam.   Recent Labs  10/02/13 0625  HGB 12.5  HCT 35.5*    Assessment/Plan: Plan for discharge tomorrow, Breastfeeding and Lactation consult  Has already been seen by Lactation   LOS: 2 days   Danielle Rubio 10/04/2013, 5:52 AM

## 2013-10-05 NOTE — Discharge Instructions (Signed)

## 2013-10-05 NOTE — Discharge Summary (Signed)
Obstetric Discharge Summary Reason for Admission: onset of labor Prenatal Procedures: ultrasound Intrapartum Procedures: spontaneous vaginal delivery Postpartum Procedures: none Complications-Operative and Postpartum: vaginal laceration Hemoglobin  Date Value Ref Range Status  10/02/2013 12.5  12.0 - 15.0 g/dL Final     HCT  Date Value Ref Range Status  10/02/2013 35.5* 36.0 - 46.0 % Final   Danielle Rubio is a 28 y.o. female G1P0 with IUP at [redacted]w[redacted]d presented with PROM.  She was managed expectantantly for 24 hours with no change and then started on pitocin.  She delivered rapidly thereafter.  At 6:04 AM (6/07) a viable female was delivered in the tub via (Presentation: Left Occiput Anterior). APGAR:7 ,8 ; Infant delivered and lifted to pt's abd and dried. She plans to breast feed and use condoms for contraception.   Physical Exam:  General: alert, cooperative and no distress Lochia: appropriate Uterine Fundus: firm Incision: n/a DVT Evaluation: No evidence of DVT seen on physical exam.  Discharge Diagnoses: Term Pregnancy-delivered  Discharge Information: Date: 10/05/2013 Activity: unrestricted Diet: routine Medications: Ibuprofen Condition: stable Instructions: refer to practice specific booklet Discharge to: home Follow-up Information   Follow up with [facility]. Schedule an appointment as soon as possible for a visit in 6 weeks. (postpartum follow up)    Contact information:   [address] [place] [address]       Newborn Data: Live born female  Birth Weight: 8 lb 0.4 oz (3640 g) APGAR: 7, 8  Home with mother.  Danielle Rubio 10/05/2013, 7:09 AM  I have seen and examined this patient and agree with above documentation in the resident's note.   Rulon Abide, M.D. [facility] Fellow 10/05/2013 7:52 AM

## 2013-10-05 NOTE — Lactation Note (Signed)
This note was copied from the chart of Girl [employer]. Lactation Consultation Note  Follow up consult:  Assisted mother in latching baby in various positions for left breast.  Difficult latch.  Baby latched in side lying position with #20NS.  Sucks and swallows heard.  Colostrum in NS after. Baby makes lots of noise when sucking on breast.  Concerned that latch may not be the best and hopefully will improve. Weighed baby before and after feeding and with NS and baby took 5 ml. Then mother placed baby in cross cradle hold on right breast.  After weighing baby took 5 ml more without the NS. Set up DEBP and mother pumped both breasts for 15 min.  Gave baby pumped volume of 5 ml finger feed with syringe. Dad did teachback on how to syringe finger feed.  Provided parents with an additional NS and foley cup. Mother has DEBP at home.  Plan is for mother to massage and hand express.  Then after breastfeeding post pump for 15 min. both breasts. Give baby back volume that is pumped based on volume guidelines.   Suggest parents monitor voids and stools, keep doing lots of STS and wake baby if she doesn't wake for feedings. Mom encouraged to feed baby 8-12 times/24 hours and with feeding cues.  Encouraged parents to make an outpatient appt and call us for further questions.      Patient Name: Girl Lalaina Youngers ZHGDJ'M Date: 10/05/2013 Reason for consult: Follow-up assessment   Maternal Data    Feeding Feeding Type: Breast Fed Length of feed: 30 min (on left with #20NS)  LATCH Score/Interventions Latch: Grasps breast easily, tongue down, lips flanged, rhythmical sucking. Intervention(s): Skin to skin Intervention(s): Assist with latch  Audible Swallowing: A few with stimulation  Type of Nipple: Everted at rest and after stimulation  Comfort (Breast/Nipple): Filling, red/small blisters or bruises, mild/mod discomfort  Problem noted: Cracked, bleeding, blisters,  bruises Interventions (Filling): Double electric pump;Hand pump Interventions  (Cracked/bleeding/bruising/blister): Expressed breast milk to nipple Interventions (Mild/moderate discomfort): Comfort gels  Hold (Positioning): Assistance needed to correctly position infant at breast and maintain latch.  LATCH Score: 7  Lactation Tools Discussed/Used Tools: Pump   Consult Status Consult Status: Follow-up Date: 10/06/13 Follow-up type: In-patient    Dulce Sellar Berkelhammer 10/05/2013, 12:46 PM

## 2013-10-05 NOTE — Lactation Note (Signed)
This note was copied from the chart of Danielle [employer]. Lactation Consultation Note  Follow up consult:  Infant latched in cross cradle hold upon entering the room.  Sucks and some swallows observed, some with stimulation.  LS6. Some NNS observed.  Infant's chin is recessed and has difficulty maintaining latch. Infant has not voided since 80 Mother has positional stripe on right nipple and it is compressed when infant unlatched.  Encouraged mother to achieve more depth by firmly compressing breast. Infant had moderate projectile emesis, yellow curdled milk with mucous immediately after feeding. Attempted latching infant in both fb and cross cradle hold on left breast with and w/o a #20NS and infant would not latch. Encouraged mother to place baby STS and call LC to view next feeding and weigh her before and after feeding to check transfer of milk.      Mother has good flow of colostrum.    Patient Name: Danielle Rubio [id number]'R Date: 10/05/2013 Reason for consult: Follow-up assessment   Maternal Data    Feeding Feeding Type: Breast Fed (latched upon entering room) Length of feed: 20 min  LATCH Score/Interventions Latch: Repeated attempts needed to sustain latch, nipple held in mouth throughout feeding, stimulation needed to elicit sucking reflex. Intervention(s): Skin to skin Intervention(s): Assist with latch;Breast massage;Breast compression  Audible Swallowing: A few with stimulation Intervention(s): Hand expression;Skin to skin Intervention(s): Alternate breast massage  Type of Nipple: Everted at rest and after stimulation  Comfort (Breast/Nipple): Filling, red/small blisters or bruises, mild/mod discomfort  Problem noted: Cracked, bleeding, blisters, bruises Interventions (Filling): Hand pump;Firm support Interventions  (Cracked/bleeding/bruising/blister): Expressed breast milk to nipple Interventions (Mild/moderate discomfort): Comfort gels;Hand  expression  Hold (Positioning): Assistance needed to correctly position infant at breast and maintain latch. Intervention(s): Position options  LATCH Score: 6  Lactation Tools Discussed/Used Tools: Pump   Consult Status Consult Status: Follow-up Date: 10/06/13 Follow-up type: In-patient    Dulce Sellar Berkelhammer 10/05/2013, 9:01 AM

## 2013-10-06 NOTE — Discharge Summary (Signed)
Attestation of Attending Supervision of Advanced Practitioner (CNM/NP): Evaluation and management procedures were performed by the Advanced Practitioner under my supervision and collaboration.  I have reviewed the Advanced Practitioner's note and chart, and I agree with the management and plan.  Latreshia Beauchaine 10/06/2013 4:07 PM   

## 2013-10-07 ENCOUNTER — Ambulatory Visit (HOSPITAL_COMMUNITY)
Admission: RE | Admit: 2013-10-07 | Discharge: 2013-10-07 | Disposition: A | Discharge status: Home / Self Care | Payer: Self-pay | Source: Ambulatory Visit | Attending: Obstetrics & Gynecology | Admitting: Obstetrics & Gynecology

## 2013-10-07 ENCOUNTER — Encounter (HOSPITAL_COMMUNITY): Payer: Self-pay

## 2013-10-07 ENCOUNTER — Ambulatory Visit (HOSPITAL_COMMUNITY): Payer: BC Managed Care – PPO

## 2013-10-07 NOTE — Lactation Note (Addendum)
Adult Lactation Consultation Outpatient Visit Note  Patient Name: Danielle Rubio Date of Birth: Nov 21, 1985 Gestational Age at Delivery: Unknown Type of Delivery:   Breastfeeding History: Frequency of Breastfeeding: q 2-3 Length of Feeding: 30 Voids: 4- since DC yesterday Stools: 2- since DC yesterday  Supplementing / Method: Pumping:  Type of Pump: Medela PIS   Frequency: q feeding  Volume:  20 cc's last pumping  Comments: Mom reports that her milk started coming in this morning. Woke up feeling fuller- milk more whitish now    Consultation Evaluation:  Initial Feeding Assessment: Pre-feed Weight: 7- 6.3  3352g Post-feed Weight: 7- 6.6  3362g Amount Transferred: 10 Comments: Danielle Rubio nursed for 15 minutes with NS. Mom does not want to try to nurse without it due to sore nipples. Both nipples with stripe across them. Using EBM and comfort gels for soreness.Some swallows noted. Lots of non nutritive sucking noted  Additional Feeding Assessment: Pre-feed Weight: 7- 6.6  3362g Post-feed Weight: 7- 6.9  3372g Amount Transferred: 10 Comments: Danielle Rubio nursed for 15 minutes with NS in cross cradle hold. More swallows noted on this breast,  Finger fed by parents 10 mote cc's EBM off to sleep.    Total Breast milk Transferred this Visit: 20 Total Supplement Given: 10 cc's EBM  Additional Interventions: To continue same plan. Continue pumping after feeding and feeding EBM to baby Keep feeding diary, voids and stools and take to Ped at appointment  Follow-Up With Ped on Monday To call here if needs another appointment or questions/concerns [facility] (parents live 45 min away)    Audry Riles D 10/07/2013, 10:08 AM

## 2013-10-11 ENCOUNTER — Ambulatory Visit (HOSPITAL_COMMUNITY): Admission: RE | Admit: 2013-10-11 | Payer: BC Managed Care – PPO | Source: Ambulatory Visit

## 2013-11-08 ENCOUNTER — Ambulatory Visit: Payer: Self-pay | Admitting: Family Medicine

## 2013-11-22 ENCOUNTER — Ambulatory Visit (INDEPENDENT_AMBULATORY_CARE_PROVIDER_SITE_OTHER): Payer: BC Managed Care – PPO | Admitting: Family Medicine

## 2013-11-22 ENCOUNTER — Encounter: Payer: Self-pay | Admitting: Family Medicine

## 2013-11-22 NOTE — Progress Notes (Signed)
Subjective:     Patient ID: Danielle Rubio, female   DOB: 21-Jul-1985, 28 y.o.   MRN: [id number]  HPI  G1P1 s/p augmented SVD after PROM 41w, tub delivery with labial split laceration.  Feeling well, no complaints.  Breast feeding, has had intercourse and did not experience any dyspareunia.  Lochia has resolved.  Using condoms for contraceptions, declines any other form.  Would like Rx for breast pump.   Review of Systems  Constitutional: Negative for chills, diaphoresis and fatigue.  Respiratory: Negative for cough and shortness of breath.   Gastrointestinal: Negative for nausea and diarrhea.  Genitourinary: Negative for urgency, decreased urine volume and pelvic pain.  Neurological: Negative for light-headedness and headaches.  Psychiatric/Behavioral: Negative for suicidal ideas, dysphoric mood and decreased concentration.       Objective:   Physical Exam  Constitutional: She is oriented to person, place, and time. She appears well-developed and well-nourished. No distress.  HENT:  Head: Atraumatic.  Eyes: Conjunctivae are normal. Pupils are equal, round, and reactive to light.  Neck: Normal range of motion. Neck supple.  Cardiovascular: Normal rate and intact distal pulses.   Pulmonary/Chest: Effort normal. No respiratory distress.  Abdominal: Soft. Bowel sounds are normal. She exhibits no distension. There is no tenderness.  Genitourinary: Vagina normal. No vaginal discharge found.  Neurological: She is alert and oriented to person, place, and time.  Skin: Skin is warm and dry. She is not diaphoretic.  There were no vitals taken for this visit.      Assessment:     G1P1 healthy postpartum visit     Plan:     Rx for electric double breast pump given to patient Return to clinic prn

## 2013-12-01 ENCOUNTER — Encounter (HOSPITAL_COMMUNITY): Payer: Self-pay | Admitting: *Deleted

## 2014-02-01 ENCOUNTER — Encounter: Payer: Self-pay | Admitting: *Deleted

## 2014-02-28 ENCOUNTER — Encounter (HOSPITAL_COMMUNITY): Payer: Self-pay | Admitting: *Deleted

## 2014-10-10 ENCOUNTER — Encounter: Payer: Self-pay | Admitting: Advanced Practice Midwife

## 2014-10-10 ENCOUNTER — Other Ambulatory Visit: Payer: Self-pay | Admitting: Advanced Practice Midwife

## 2014-10-10 ENCOUNTER — Ambulatory Visit (INDEPENDENT_AMBULATORY_CARE_PROVIDER_SITE_OTHER): Payer: BC Managed Care – PPO | Admitting: Advanced Practice Midwife

## 2014-10-10 VITALS — BP 109/77 | HR 88

## 2014-10-10 DIAGNOSIS — Z124 Encounter for screening for malignant neoplasm of cervix: Secondary | ICD-10-CM

## 2014-10-10 DIAGNOSIS — Z1151 Encounter for screening for human papillomavirus (HPV): Secondary | ICD-10-CM | POA: Diagnosis not present

## 2014-10-10 DIAGNOSIS — Z3481 Encounter for supervision of other normal pregnancy, first trimester: Secondary | ICD-10-CM | POA: Diagnosis not present

## 2014-10-10 DIAGNOSIS — Z3491 Encounter for supervision of normal pregnancy, unspecified, first trimester: Secondary | ICD-10-CM

## 2014-10-10 NOTE — Progress Notes (Signed)
Pt needs PAP Bedside US shows single IUP measuring [redacted]w[redacted]d CRL and 152 FHR.

## 2014-10-10 NOTE — Patient Instructions (Signed)
First Trimester of Pregnancy The first trimester of pregnancy is from week 1 until the end of week 12 (months 1 through 3). A week after a sperm fertilizes an egg, the egg will implant on the wall of the uterus. This embryo will begin to develop into a baby. Genes from you and your partner are forming the baby. The female genes determine whether the baby is a boy or a girl. At 6-8 weeks, the eyes and face are formed, and the heartbeat can be seen on ultrasound. At the end of 12 weeks, all the baby's organs are formed.  Now that you are pregnant, you will want to do everything you can to have a healthy baby. Two of the most important things are to get good prenatal care and to follow your health care provider's instructions. Prenatal care is all the medical care you receive before the baby's birth. This care will help prevent, find, and treat any problems during the pregnancy and childbirth. BODY CHANGES Your body goes through many changes during pregnancy. The changes vary from woman to woman.   You may gain or lose a couple of pounds at first.  You may feel sick to your stomach (nauseous) and throw up (vomit). If the vomiting is uncontrollable, call your health care provider.  You may tire easily.  You may develop headaches that can be relieved by medicines approved by your health care provider.  You may urinate more often. Painful urination may mean you have a bladder infection.  You may develop heartburn as a result of your pregnancy.  You may develop constipation because certain hormones are causing the muscles that push waste through your intestines to slow down.  You may develop hemorrhoids or swollen, bulging veins (varicose veins).  Your breasts may begin to grow larger and become tender. Your nipples may stick out more, and the tissue that surrounds them (areola) may become darker.  Your gums may bleed and may be sensitive to brushing and flossing.  Dark spots or blotches (chloasma,  mask of pregnancy) may develop on your face. This will likely fade after the baby is born.  Your menstrual periods will stop.  You may have a loss of appetite.  You may develop cravings for certain kinds of food.  You may have changes in your emotions from day to day, such as being excited to be pregnant or being concerned that something may go wrong with the pregnancy and baby.  You may have more vivid and strange dreams.  You may have changes in your hair. These can include thickening of your hair, rapid growth, and changes in texture. Some women also have hair loss during or after pregnancy, or hair that feels dry or thin. Your hair will most likely return to normal after your baby is born. WHAT TO EXPECT AT YOUR PRENATAL VISITS During a routine prenatal visit:  You will be weighed to make sure you and the baby are growing normally.  Your blood pressure will be taken.  Your abdomen will be measured to track your baby's growth.  The fetal heartbeat will be listened to starting around week 10 or 12 of your pregnancy.  Test results from any previous visits will be discussed. Your health care provider may ask you:  How you are feeling.  If you are feeling the baby move.  If you have had any abnormal symptoms, such as leaking fluid, bleeding, severe headaches, or abdominal cramping.  If you have any questions. Other tests   that may be performed during your first trimester include:  Blood tests to find your blood type and to check for the presence of any previous infections. They will also be used to check for low iron levels (anemia) and Rh antibodies. Later in the pregnancy, blood tests for diabetes will be done along with other tests if problems develop.  Urine tests to check for infections, diabetes, or protein in the urine.  An ultrasound to confirm the proper growth and development of the baby.  An amniocentesis to check for possible genetic problems.  Fetal screens for  spina bifida and Down syndrome.  You may need other tests to make sure you and the baby are doing well. HOME CARE INSTRUCTIONS  Medicines  Follow your health care provider's instructions regarding medicine use. Specific medicines may be either safe or unsafe to take during pregnancy.  Take your prenatal vitamins as directed.  If you develop constipation, try taking a stool softener if your health care provider approves. Diet  Eat regular, well-balanced meals. Choose a variety of foods, such as meat or vegetable-based protein, fish, milk and low-fat dairy products, vegetables, fruits, and whole grain breads and cereals. Your health care provider will help you determine the amount of weight gain that is right for you.  Avoid raw meat and uncooked cheese. These carry germs that can cause birth defects in the baby.  Eating four or five small meals rather than three large meals a day may help relieve nausea and vomiting. If you start to feel nauseous, eating a few soda crackers can be helpful. Drinking liquids between meals instead of during meals also seems to help nausea and vomiting.  If you develop constipation, eat more high-fiber foods, such as fresh vegetables or fruit and whole grains. Drink enough fluids to keep your urine clear or pale yellow. Activity and Exercise  Exercise only as directed by your health care provider. Exercising will help you:  Control your weight.  Stay in shape.  Be prepared for labor and delivery.  Experiencing pain or cramping in the lower abdomen or low back is a good sign that you should stop exercising. Check with your health care provider before continuing normal exercises.  Try to avoid standing for long periods of time. Move your legs often if you must stand in one place for a long time.  Avoid heavy lifting.  Wear low-heeled shoes, and practice good posture.  You may continue to have sex unless your health care provider directs you  otherwise. Relief of Pain or Discomfort  Wear a good support bra for breast tenderness.   Take warm sitz baths to soothe any pain or discomfort caused by hemorrhoids. Use hemorrhoid cream if your health care provider approves.   Rest with your legs elevated if you have leg cramps or low back pain.  If you develop varicose veins in your legs, wear support hose. Elevate your feet for 15 minutes, 3-4 times a day. Limit salt in your diet. Prenatal Care  Schedule your prenatal visits by the twelfth week of pregnancy. They are usually scheduled monthly at first, then more often in the last 2 months before delivery.  Write down your questions. Take them to your prenatal visits.  Keep all your prenatal visits as directed by your health care provider. Safety  Wear your seat belt at all times when driving.  Make a list of emergency phone numbers, including numbers for family, friends, the hospital, and police and fire departments. General Tips    Ask your health care provider for a referral to a local prenatal education class. Begin classes no later than at the beginning of month 6 of your pregnancy.  Ask for help if you have counseling or nutritional needs during pregnancy. Your health care provider can offer advice or refer you to specialists for help with various needs.  Do not use hot tubs, steam rooms, or saunas.  Do not douche or use tampons or scented sanitary pads.  Do not cross your legs for long periods of time.  Avoid cat litter boxes and soil used by cats. These carry germs that can cause birth defects in the baby and possibly loss of the fetus by miscarriage or stillbirth.  Avoid all smoking, herbs, alcohol, and medicines not prescribed by your health care provider. Chemicals in these affect the formation and growth of the baby.  Schedule a dentist appointment. At home, brush your teeth with a soft toothbrush and be gentle when you floss. SEEK MEDICAL CARE IF:   You have  dizziness.  You have mild pelvic cramps, pelvic pressure, or nagging pain in the abdominal area.  You have persistent nausea, vomiting, or diarrhea.  You have a bad smelling vaginal discharge.  You have pain with urination.  You notice increased swelling in your face, hands, legs, or ankles. SEEK IMMEDIATE MEDICAL CARE IF:   You have a fever.  You are leaking fluid from your vagina.  You have spotting or bleeding from your vagina.  You have severe abdominal cramping or pain.  You have rapid weight gain or loss.  You vomit blood or material that looks like coffee grounds.  You are exposed to German measles and have never had them.  You are exposed to fifth disease or chickenpox.  You develop a severe headache.  You have shortness of breath.  You have any kind of trauma, such as from a fall or a car accident. Document Released: 04/09/2001 Document Revised: 08/30/2013 Document Reviewed: 02/23/2013 Exit[employer] Patient Information 2015 [employer]. This information is not intended to replace advice given to you by your health care provider. Make sure you discuss any questions you have with your health care provider.  

## 2014-10-10 NOTE — Progress Notes (Signed)
   Subjective:    Danielle Rubio is a G2P1001 [redacted]w[redacted]d being seen today for her first obstetrical visit.  Her obstetrical history is significant for close pregnancy spacing.. Patient does intend to breast feed. Pregnancy history fully reviewed.  Patient reports no complaints.  Filed Vitals:   10/10/14 1000  BP: 109/77  Pulse: 88    HISTORY: OB History  Gravida Para Term Preterm AB SAB TAB Ectopic Multiple Living  2 1 1       1     # Outcome Date GA Lbr Len/2nd Weight Sex Delivery Anes PTL Lv  2 Current           1 Term 10/03/13 [redacted]w[redacted]d 02:26 / 00:38 8 lb 0.4 oz (3.64 kg) F Vag-Spont None  Y     Comments: See note in infant chart. Course respirations and slow to gain color.     Past Medical History  Diagnosis Date  . Medical history non-contributory    Past Surgical History  Procedure Laterality Date  . Wisdom tooth extraction    . No past surgeries     Family History  Problem Relation Age of Onset  . Cancer Maternal Aunt     bladder cancer  . Alzheimer's disease Maternal Grandmother   . Cancer Maternal Grandfather     pancreatic     Exam   CRL 7-4 weeks. Pos FHR Uterus:     Pelvic Exam:    Perineum: No Hemorrhoids, Normal Perineum   Vulva: Bartholin's, Urethra, Skene's normal   Vagina:  normal mucosa, normal discharge   pH: NA   Cervix: multiparous appearance, no bleeding following Pap and no cervical motion tenderness   Adnexa: normal adnexa and no mass, fullness, tenderness   Bony Pelvis: gynecoid  System: Breast:  normal appearance, no masses or tenderness, No nipple retraction or dimpling, No nipple discharge or bleeding, No axillary or supraclavicular adenopathy   Skin: normal coloration and turgor, no rashes    Neurologic: oriented, normal mood, grossly non-focal   Extremities: normal strength, tone, and muscle mass   HEENT sclera clear, anicteric, neck supple with midline trachea and thyroid without masses   Mouth/Teeth mucous membranes moist, pharynx  normal without lesions and dental hygiene good   Neck supple and no masses   Cardiovascular: regular rate and rhythm, no murmurs or gallops   Respiratory:  appears well, vitals normal, no respiratory distress, acyanotic, normal RR, chest clear, no wheezing, crepitations, rhonchi, normal symmetric air entry   Abdomen: soft, non-tender; bowel sounds normal; no masses,  no organomegaly   Urinary: urethral meatus normal      Assessment:    Pregnancy: G2P1001 Patient Active Problem List   Diagnosis Date Noted  . Supervision of normal pregnancy in first trimester 10/10/2014        Plan:     Initial labs drawn. Prenatal vitamins. Problem list reviewed and updated. Genetic Screening discussed First Screen: declined.  Ultrasound discussed; fetal survey: requested.  Follow up in 4 weeks. Plans waterbirth. Had one last year. Has supplies. Completed class last time. Does not need to repeat.    Dorathy Kinsman 10/10/2014

## 2014-10-11 LAB — PRENATAL PROFILE (SOLSTAS)
ANTIBODY SCREEN: NEGATIVE
Basophils Absolute: 0 10*3/uL (ref 0.0–0.1)
Basophils Relative: 0 % (ref 0–1)
EOS PCT: 0 % (ref 0–5)
Eosinophils Absolute: 0 10*3/uL (ref 0.0–0.7)
HCT: 39.1 % (ref 36.0–46.0)
HIV 1&2 Ab, 4th Generation: NONREACTIVE
Hemoglobin: 13.1 g/dL (ref 12.0–15.0)
Hepatitis B Surface Ag: NEGATIVE
LYMPHS ABS: 1.9 10*3/uL (ref 0.7–4.0)
Lymphocytes Relative: 34 % (ref 12–46)
MCH: 29.8 pg (ref 26.0–34.0)
MCHC: 33.5 g/dL (ref 30.0–36.0)
MCV: 89.1 fL (ref 78.0–100.0)
MPV: 11.2 fL (ref 8.6–12.4)
Monocytes Absolute: 0.4 10*3/uL (ref 0.1–1.0)
Monocytes Relative: 8 % (ref 3–12)
Neutro Abs: 3.2 10*3/uL (ref 1.7–7.7)
Neutrophils Relative %: 58 % (ref 43–77)
Platelets: 190 10*3/uL (ref 150–400)
RBC: 4.39 MIL/uL (ref 3.87–5.11)
RDW: 14.2 % (ref 11.5–15.5)
RUBELLA: 4.52 {index} — AB (ref <0.90)
Rh Type: POSITIVE
WBC: 5.5 10*3/uL (ref 4.0–10.5)

## 2014-10-11 LAB — GC/CHLAMYDIA PROBE AMP
CT PROBE, AMP APTIMA: NEGATIVE
GC PROBE AMP APTIMA: NEGATIVE

## 2014-10-12 ENCOUNTER — Telehealth: Payer: Self-pay | Admitting: *Deleted

## 2014-10-12 LAB — CULTURE, OB URINE
Colony Count: NO GROWTH
Organism ID, Bacteria: NO GROWTH

## 2014-10-12 LAB — CYTOLOGY - PAP

## 2014-10-12 NOTE — Telephone Encounter (Signed)
LM on voicemail of normal labs 

## 2014-11-14 ENCOUNTER — Encounter: Payer: Self-pay | Admitting: Advanced Practice Midwife

## 2014-11-14 ENCOUNTER — Ambulatory Visit (INDEPENDENT_AMBULATORY_CARE_PROVIDER_SITE_OTHER): Payer: BC Managed Care – PPO | Admitting: Advanced Practice Midwife

## 2014-11-14 VITALS — BP 120/76 | HR 93 | Wt 170.0 lb

## 2014-11-14 DIAGNOSIS — Z3491 Encounter for supervision of normal pregnancy, unspecified, first trimester: Secondary | ICD-10-CM

## 2014-11-14 DIAGNOSIS — Z3481 Encounter for supervision of other normal pregnancy, first trimester: Secondary | ICD-10-CM

## 2014-11-14 NOTE — Progress Notes (Signed)
Subjective:  Danielle Rubio is a 29 y.o. G2P1001 at 9632w5d being seen today for ongoing prenatal care.  Patient reports no complaints.  Contractions: Not present.  Vag. Bleeding: None. Movement: Absent. Denies leaking of fluid.   The following portions of the patient's history were reviewed and updated as appropriate: allergies, current medications, past family history, past medical history, past social history, past surgical history and problem list.   Objective:   Filed Vitals:   11/14/14 1001  BP: 120/76  Pulse: 93  Weight: 170 lb (77.111 kg)    Fetal Status: Fetal Heart Rate (bpm): 155   Movement: Absent     General:  Alert, oriented and cooperative. Patient is in no acute distress.  Skin: Skin is warm and dry. No rash noted.   Cardiovascular: Normal heart rate noted  Respiratory: Normal respiratory effort, no problems with respiration noted  Abdomen: Soft, gravid, appropriate for gestational age. Pain/Pressure: Absent     Vaginal: Vag. Bleeding: None.    Vag D/C Character: Thin  Cervix: Not evaluated        Extremities: Normal range of motion.  Edema: None  Mental Status: Normal mood and affect. Normal behavior. Normal judgment and thought content.   Urinalysis: Urine Protein: Negative Urine Glucose: Negative  Assessment and Plan:  Pregnancy: G2P1001 at 5232w5d  1. Supervision of normal pregnancy in first trimester      Feels well. No bleeding. - US OB Comp + 14 Wk; Future  Preterm labor symptoms and general obstetric precautions including but not limited to vaginal bleeding, contractions, leaking of fluid and fetal movement were reviewed in detail with the patient. Please refer to After Visit Summary for other counseling recommendations.  Return in about 4 weeks (around 12/12/2014) [place] [facility].   Aviva SignsMarie L Williams, CNM

## 2014-11-14 NOTE — Patient Instructions (Signed)
Second Trimester of Pregnancy The second trimester is from week 13 through week 28, months 4 through 6. The second trimester is often a time when you feel your best. Your body has also adjusted to being pregnant, and you begin to feel better physically. Usually, morning sickness has lessened or quit completely, you may have more energy, and you may have an increase in appetite. The second trimester is also a time when the fetus is growing rapidly. At the end of the sixth month, the fetus is about 9 inches long and weighs about 1 pounds. You will likely begin to feel the baby move (quickening) between 18 and 20 weeks of the pregnancy. BODY CHANGES Your body goes through many changes during pregnancy. The changes vary from woman to woman.   Your weight will continue to increase. You will notice your lower abdomen bulging out.  You may begin to get stretch marks on your hips, abdomen, and breasts.  You may develop headaches that can be relieved by medicines approved by your health care provider.  You may urinate more often because the fetus is pressing on your bladder.  You may develop or continue to have heartburn as a result of your pregnancy.  You may develop constipation because certain hormones are causing the muscles that push waste through your intestines to slow down.  You may develop hemorrhoids or swollen, bulging veins (varicose veins).  You may have back pain because of the weight gain and pregnancy hormones relaxing your joints between the bones in your pelvis and as a result of a shift in weight and the muscles that support your balance.  Your breasts will continue to grow and be tender.  Your gums may bleed and may be sensitive to brushing and flossing.  Dark spots or blotches (chloasma, mask of pregnancy) may develop on your face. This will likely fade after the baby is born.  A dark line from your belly button to the pubic area (linea nigra) may appear. This will likely fade  after the baby is born.  You may have changes in your hair. These can include thickening of your hair, rapid growth, and changes in texture. Some women also have hair loss during or after pregnancy, or hair that feels dry or thin. Your hair will most likely return to normal after your baby is born. WHAT TO EXPECT AT YOUR PRENATAL VISITS During a routine prenatal visit:  You will be weighed to make sure you and the fetus are growing normally.  Your blood pressure will be taken.  Your abdomen will be measured to track your baby's growth.  The fetal heartbeat will be listened to.  Any test results from the previous visit will be discussed. Your health care provider may ask you:  How you are feeling.  If you are feeling the baby move.  If you have had any abnormal symptoms, such as leaking fluid, bleeding, severe headaches, or abdominal cramping.  If you have any questions. Other tests that may be performed during your second trimester include:  Blood tests that check for:  Low iron levels (anemia).  Gestational diabetes (between 24 and 28 weeks).  Rh antibodies.  Urine tests to check for infections, diabetes, or protein in the urine.  An ultrasound to confirm the proper growth and development of the baby.  An amniocentesis to check for possible genetic problems.  Fetal screens for spina bifida and Down syndrome. HOME CARE INSTRUCTIONS   Avoid all smoking, herbs, alcohol, and unprescribed   drugs. These chemicals affect the formation and growth of the baby.  Follow your health care provider's instructions regarding medicine use. There are medicines that are either safe or unsafe to take during pregnancy.  Exercise only as directed by your health care provider. Experiencing uterine cramps is a good sign to stop exercising.  Continue to eat regular, healthy meals.  Wear a good support bra for breast tenderness.  Do not use hot tubs, steam rooms, or saunas.  Wear your  seat belt at all times when driving.  Avoid raw meat, uncooked cheese, cat litter boxes, and soil used by cats. These carry germs that can cause birth defects in the baby.  Take your prenatal vitamins.  Try taking a stool softener (if your health care provider approves) if you develop constipation. Eat more high-fiber foods, such as fresh vegetables or fruit and whole grains. Drink plenty of fluids to keep your urine clear or pale yellow.  Take warm sitz baths to soothe any pain or discomfort caused by hemorrhoids. Use hemorrhoid cream if your health care provider approves.  If you develop varicose veins, wear support hose. Elevate your feet for 15 minutes, 3-4 times a day. Limit salt in your diet.  Avoid heavy lifting, wear low heel shoes, and practice good posture.  Rest with your legs elevated if you have leg cramps or low back pain.  Visit your dentist if you have not gone yet during your pregnancy. Use a soft toothbrush to brush your teeth and be gentle when you floss.  A sexual relationship may be continued unless your health care provider directs you otherwise.  Continue to go to all your prenatal visits as directed by your health care provider. SEEK MEDICAL CARE IF:   You have dizziness.  You have mild pelvic cramps, pelvic pressure, or nagging pain in the abdominal area.  You have persistent nausea, vomiting, or diarrhea.  You have a bad smelling vaginal discharge.  You have pain with urination. SEEK IMMEDIATE MEDICAL CARE IF:   You have a fever.  You are leaking fluid from your vagina.  You have spotting or bleeding from your vagina.  You have severe abdominal cramping or pain.  You have rapid weight gain or loss.  You have shortness of breath with chest pain.  You notice sudden or extreme swelling of your face, hands, ankles, feet, or legs.  You have not felt your baby move in over an hour.  You have severe headaches that do not go away with  medicine.  You have vision changes. Document Released: 04/09/2001 Document Revised: 04/20/2013 Document Reviewed: 06/16/2012 Exit[employer] Patient Information 2015 [employer]. This information is not intended to replace advice given to you by your health care provider. Make sure you discuss any questions you have with your health care provider.  

## 2014-12-15 ENCOUNTER — Telehealth: Payer: Self-pay

## 2014-12-15 NOTE — Telephone Encounter (Signed)
Left message for pt to call for her Sept. 6th appt. Pt is on Babyscripts.

## 2014-12-23 ENCOUNTER — Other Ambulatory Visit: Payer: Self-pay | Admitting: Advanced Practice Midwife

## 2014-12-23 DIAGNOSIS — Z1389 Encounter for screening for other disorder: Secondary | ICD-10-CM

## 2014-12-28 ENCOUNTER — Ambulatory Visit (HOSPITAL_COMMUNITY)
Admission: RE | Admit: 2014-12-28 | Discharge: 2014-12-28 | Disposition: A | Discharge status: Home / Self Care | Payer: BC Managed Care – PPO | Source: Ambulatory Visit | Attending: Advanced Practice Midwife | Admitting: Advanced Practice Midwife

## 2014-12-28 DIAGNOSIS — Z1389 Encounter for screening for other disorder: Secondary | ICD-10-CM

## 2014-12-28 DIAGNOSIS — Z36 Encounter for antenatal screening of mother: Secondary | ICD-10-CM | POA: Diagnosis not present

## 2014-12-28 DIAGNOSIS — Z3491 Encounter for supervision of normal pregnancy, unspecified, first trimester: Secondary | ICD-10-CM

## 2015-01-03 ENCOUNTER — Ambulatory Visit (INDEPENDENT_AMBULATORY_CARE_PROVIDER_SITE_OTHER): Payer: BC Managed Care – PPO | Admitting: Obstetrics & Gynecology

## 2015-01-03 VITALS — BP 102/62 | HR 87 | Wt 174.0 lb

## 2015-01-03 DIAGNOSIS — Z3482 Encounter for supervision of other normal pregnancy, second trimester: Secondary | ICD-10-CM

## 2015-01-03 DIAGNOSIS — Z3491 Encounter for supervision of normal pregnancy, unspecified, first trimester: Secondary | ICD-10-CM

## 2015-01-03 NOTE — Progress Notes (Signed)
Subjective:  Danielle Rubio is a 29 y.o. G2P1001 at [redacted]w[redacted]d being seen today for ongoing prenatal care.  Patient reports no complaints.  Contractions: Not present.  Vag. Bleeding: None. Movement: Absent. Denies leaking of fluid.   The following portions of the patient's history were reviewed and updated as appropriate: allergies, current medications, past family history, past medical history, past social history, past surgical history and problem list.   Objective:   Filed Vitals:   01/03/15 1542  BP: 102/62  Pulse: 87  Weight: 174 lb (78.926 kg)    Fetal Status: Fetal Heart Rate (bpm): 147   Movement: Absent     General:  Alert, oriented and cooperative. Patient is in no acute distress.  Skin: Skin is warm and dry. No rash noted.   Cardiovascular: Normal heart rate noted  Respiratory: Normal respiratory effort, no problems with respiration noted  Abdomen: Soft, gravid, appropriate for gestational age. Pain/Pressure: Absent     Pelvic: Vag. Bleeding: None Vag D/C Character: Thin   Cervical exam deferred        Extremities: Normal range of motion.  Edema: None  Mental Status: Normal mood and affect. Normal behavior. Normal judgment and thought content.   Urinalysis: Urine Protein: Negative Urine Glucose: Negative  Assessment and Plan:  Pregnancy: G2P1001 at [redacted]w[redacted]d  1. Supervision of normal pregnancy in first trimester -29% growth and all anatomy not seen.  F/U anatomy in 4 weeks - US OB Follow Up; Future -pt refuses flu shot  Preterm labor symptoms and general obstetric precautions including but not limited to vaginal bleeding, contractions, leaking of fluid and fetal movement were reviewed in detail with the patient. Please refer to After Visit Summary for other counseling recommendations.  RTC 8 weeks  Danielle Dukes, MD

## 2015-01-25 ENCOUNTER — Other Ambulatory Visit: Payer: Self-pay | Admitting: Obstetrics & Gynecology

## 2015-01-25 ENCOUNTER — Ambulatory Visit (HOSPITAL_COMMUNITY)
Admission: RE | Admit: 2015-01-25 | Discharge: 2015-01-25 | Disposition: A | Discharge status: Home / Self Care | Payer: BC Managed Care – PPO | Source: Ambulatory Visit | Attending: Advanced Practice Midwife | Admitting: Advanced Practice Midwife

## 2015-01-25 DIAGNOSIS — Z3491 Encounter for supervision of normal pregnancy, unspecified, first trimester: Secondary | ICD-10-CM

## 2015-01-25 DIAGNOSIS — Z3A23 23 weeks gestation of pregnancy: Secondary | ICD-10-CM | POA: Insufficient documentation

## 2015-01-25 DIAGNOSIS — Z0489 Encounter for examination and observation for other specified reasons: Secondary | ICD-10-CM

## 2015-01-25 DIAGNOSIS — Z36 Encounter for antenatal screening of mother: Secondary | ICD-10-CM | POA: Diagnosis present

## 2015-01-25 DIAGNOSIS — IMO0002 Reserved for concepts with insufficient information to code with codable children: Secondary | ICD-10-CM

## 2015-01-26 ENCOUNTER — Telehealth: Payer: Self-pay | Admitting: *Deleted

## 2015-01-26 DIAGNOSIS — Z3492 Encounter for supervision of normal pregnancy, unspecified, second trimester: Secondary | ICD-10-CM

## 2015-01-26 NOTE — Telephone Encounter (Signed)
-----   Message from Lesly Dukes, MD sent at 01/26/2015  9:37 AM EDT ----- Lips, palate and heart not well visualized.  Pt to be offered f/u US in 3 weeks.

## 2015-01-26 NOTE — Telephone Encounter (Signed)
Lm for pt to call to discuss OB U/S and will need to have a f/u U/S in 3 weeks.

## 2015-02-13 ENCOUNTER — Ambulatory Visit (INDEPENDENT_AMBULATORY_CARE_PROVIDER_SITE_OTHER): Payer: BC Managed Care – PPO | Admitting: Obstetrics & Gynecology

## 2015-02-13 VITALS — BP 108/68 | HR 100 | Wt 182.0 lb

## 2015-02-13 DIAGNOSIS — Z3482 Encounter for supervision of other normal pregnancy, second trimester: Secondary | ICD-10-CM

## 2015-02-13 DIAGNOSIS — Z3491 Encounter for supervision of normal pregnancy, unspecified, first trimester: Secondary | ICD-10-CM

## 2015-02-13 NOTE — Progress Notes (Signed)
Subjective:  Judie Grievellyson Toran is a 29 y.o. G2P1001 at 6957w5d being seen today for ongoing prenatal care.  Patient reports no complaints.  Contractions: Not present.  Vag. Bleeding: None. Movement: Present. Denies leaking of fluid.   The following portions of the patient's history were reviewed and updated as appropriate: allergies, current medications, past family history, past medical history, past social history, past surgical history and problem list. Problem list updated.  Objective:   Filed Vitals:   02/13/15 1558  BP: 108/68  Pulse: 100  Weight: 182 lb (82.555 kg)    Fetal Status: Fetal Heart Rate (bpm): 153 Fundal Height: 25 cm Movement: Present     General:  Alert, oriented and cooperative. Patient is in no acute distress.  Skin: Skin is warm and dry. No rash noted.   Cardiovascular: Normal heart rate noted  Respiratory: Normal respiratory effort, no problems with respiration noted  Abdomen: Soft, gravid, appropriate for gestational age. Pain/Pressure: Absent     Pelvic: Vag. Bleeding: None Vag D/C Character: Thin   Cervical exam deferred        Extremities: Normal range of motion.  Edema: None  Mental Status: Normal mood and affect. Normal behavior. Normal judgment and thought content.   Urinalysis: Urine Protein: Negative Urine Glucose: Negative  Assessment and Plan:  Pregnancy: G2P1001 at 3757w5d  1. Supervision of normal pregnancy in first trimester Pt came in early to discuss need for 3rd US to look at heart.  Pt understands there were limited views of hear and lips.  [facility] and will tell peds about suboptimal views of heart and lips and to do thorough physical exam.  This is communicated on problem list.   BabyScripts next visit at 30 (pt came at 26 weeks)  Will do 30 week, then 34 weeks then 36 weeks and back on schedule.  Preterm labor symptoms and general obstetric precautions including but not limited to vaginal bleeding,  contractions, leaking of fluid and fetal movement were reviewed in detail with the patient. Please refer to After Visit Summary for other counseling recommendations.  Return in about 4 weeks (around 03/13/2015).   Lesly DukesKelly H Tracee Mccreery, MD

## 2015-02-27 ENCOUNTER — Other Ambulatory Visit (INDEPENDENT_AMBULATORY_CARE_PROVIDER_SITE_OTHER): Payer: BC Managed Care – PPO

## 2015-02-27 VITALS — BP 118/67 | HR 99 | Resp 16 | Wt 183.0 lb

## 2015-02-27 DIAGNOSIS — Z36 Encounter for antenatal screening of mother: Secondary | ICD-10-CM | POA: Diagnosis not present

## 2015-02-27 DIAGNOSIS — Z3491 Encounter for supervision of normal pregnancy, unspecified, first trimester: Secondary | ICD-10-CM

## 2015-02-27 LAB — CBC
HCT: 29.4 % — ABNORMAL LOW (ref 36.0–46.0)
Hemoglobin: 10.3 g/dL — ABNORMAL LOW (ref 12.0–15.0)
MCH: 31.6 pg (ref 26.0–34.0)
MCHC: 35 g/dL (ref 30.0–36.0)
MCV: 90.2 fL (ref 78.0–100.0)
MPV: 10.4 fL (ref 8.6–12.4)
PLATELETS: 184 10*3/uL (ref 150–400)
RBC: 3.26 MIL/uL — ABNORMAL LOW (ref 3.87–5.11)
RDW: 13.1 % (ref 11.5–15.5)
WBC: 8.5 10*3/uL (ref 4.0–10.5)

## 2015-02-27 NOTE — Progress Notes (Signed)
Pt here for 28 wk labs - declines flu and will adv about tdap at next visit

## 2015-02-28 ENCOUNTER — Encounter: Payer: BC Managed Care – PPO | Admitting: Obstetrics & Gynecology

## 2015-02-28 LAB — GLUCOSE TOLERANCE, 1 HOUR (50G) W/O FASTING: GLUCOSE 1 HOUR GTT: 90 mg/dL (ref 70–140)

## 2015-02-28 LAB — HIV ANTIBODY (ROUTINE TESTING W REFLEX): HIV: NONREACTIVE

## 2015-03-01 ENCOUNTER — Telehealth: Payer: Self-pay | Admitting: *Deleted

## 2015-03-01 LAB — RPR

## 2015-03-01 NOTE — Telephone Encounter (Signed)
LM on voicemail of normal 28 week labs. 

## 2015-03-13 ENCOUNTER — Ambulatory Visit (INDEPENDENT_AMBULATORY_CARE_PROVIDER_SITE_OTHER): Payer: BC Managed Care – PPO | Admitting: Obstetrics & Gynecology

## 2015-03-13 VITALS — BP 113/72 | HR 100 | Wt 184.0 lb

## 2015-03-13 DIAGNOSIS — Z3491 Encounter for supervision of normal pregnancy, unspecified, first trimester: Secondary | ICD-10-CM

## 2015-03-13 DIAGNOSIS — Z3481 Encounter for supervision of other normal pregnancy, first trimester: Secondary | ICD-10-CM

## 2015-03-13 NOTE — Progress Notes (Signed)
Subjective:  Danielle Rubio is a 29 y.o. G2P1001 at 30102w5d being seen today for ongoing prenatal care.  Patient reports no complaints.  Contractions: Not present.  Vag. Bleeding: None. Movement: Present. Denies leaking of fluid.   The following portions of the patient's history were reviewed and updated as appropriate: allergies, current medications, past family history, past medical history, past social history, past surgical history and problem list. Problem list updated.  Objective:   Filed Vitals:   03/13/15 1543  BP: 113/72  Pulse: 100  Weight: 184 lb (83.462 kg)    Fetal Status: Fetal Heart Rate (bpm): 140 Fundal Height: 28 cm Movement: Present     General:  Alert, oriented and cooperative. Patient is in no acute distress.  Skin: Skin is warm and dry. No rash noted.   Cardiovascular: Normal heart rate noted  Respiratory: Normal respiratory effort, no problems with respiration noted  Abdomen: Soft, gravid, appropriate for gestational age. Pain/Pressure: Present     Pelvic: Vag. Bleeding: None Vag D/C Character: Thin   Cervical exam deferred        Extremities: Normal range of motion.  Edema: None  Mental Status: Normal mood and affect. Normal behavior. Normal judgment and thought content.   Urinalysis: Urine Protein: Negative Urine Glucose: Negative  Assessment and Plan:  Pregnancy: G2P1001 at 68102w5d  1. Supervision of normal pregnancy in first trimester babyscripts Water birth consent signed Pregnancy belt for back pain, stretching too.  Preterm labor symptoms and general obstetric precautions including but not limited to vaginal bleeding, contractions, leaking of fluid and fetal movement were reviewed in detail with the patient. Please refer to After Visit Summary for other counseling recommendations.  No Follow-up on file.   Lesly DukesKelly H Lanyla Costello, MD

## 2015-04-10 ENCOUNTER — Ambulatory Visit (INDEPENDENT_AMBULATORY_CARE_PROVIDER_SITE_OTHER): Payer: BC Managed Care – PPO | Admitting: Obstetrics & Gynecology

## 2015-04-10 VITALS — BP 118/76 | HR 110 | Wt 185.0 lb

## 2015-04-10 DIAGNOSIS — Z3481 Encounter for supervision of other normal pregnancy, first trimester: Secondary | ICD-10-CM

## 2015-04-10 DIAGNOSIS — N898 Other specified noninflammatory disorders of vagina: Secondary | ICD-10-CM

## 2015-04-10 DIAGNOSIS — Z3491 Encounter for supervision of normal pregnancy, unspecified, first trimester: Secondary | ICD-10-CM

## 2015-04-10 NOTE — Progress Notes (Signed)
Subjective:  Danielle Rubio is a 29 y.o. G2P1001 at 7834w5d being seen today for ongoing prenatal care.  She is currently monitored for the following issues for this low-risk pregnancy and has Supervision of normal pregnancy in first trimester on her problem list.  Patient reports pressure above pubic bone, ? discharge.  Contractions: Not present. Vag. Bleeding: None.  Movement: Present. Denies leaking of fluid.   The following portions of the patient's history were reviewed and updated as appropriate: allergies, current medications, past family history, past medical history, past social history, past surgical history and problem list. Problem list updated.  Objective:   Filed Vitals:   04/10/15 1559  BP: 118/76  Pulse: 110  Weight: 185 lb (83.915 kg)    Fetal Status: Fetal Heart Rate (bpm): 154 Fundal Height: 33 cm Movement: Present     General:  Alert, oriented and cooperative. Patient is in no acute distress.  Skin: Skin is warm and dry. No rash noted.   Cardiovascular: Normal heart rate noted  Respiratory: Normal respiratory effort, no problems with respiration noted  Abdomen: Soft, gravid, appropriate for gestational age. Pain/Pressure: Present     Pelvic: Vag. Bleeding: None Vag D/C Character: Thin   Cervical exam performed        Extremities: Normal range of motion.  Edema: None  Mental Status: Normal mood and affect. Normal behavior. Normal judgment and thought content.   Urinalysis: Urine Protein: Negative Urine Glucose: Negative  Assessment and Plan:  Pregnancy: G2P1001 at 4834w5d  1. Supervision of normal pregnancy in first trimester -  SSE shows neg nitirizine, neg pool, neg ferning. NO EVIDENCE OF RUPTURE - Cervicovaginal ancillary only (BD affirm)  Preterm labor symptoms and general obstetric precautions including but not limited to vaginal bleeding, contractions, leaking of fluid and fetal movement were reviewed in detail with the patient. Please refer to After Visit  Summary for other counseling recommendations.  Return in about 2 weeks (around 04/24/2015).   Lesly DukesKelly H Sostenes Kauffmann, MD

## 2015-04-10 NOTE — Progress Notes (Signed)
BABYSCRIPTS PATIENT: [ ]  initial, [ ]  12, [ ]  20, [ ]  28, [x ] 32, [ ]  36, [ ]  38, [ ]  39, [ ]  40

## 2015-04-12 LAB — CERVICOVAGINAL ANCILLARY ONLY: WET PREP (BD AFFIRM): NEGATIVE

## 2015-04-25 ENCOUNTER — Ambulatory Visit (INDEPENDENT_AMBULATORY_CARE_PROVIDER_SITE_OTHER): Payer: BC Managed Care – PPO | Admitting: Obstetrics & Gynecology

## 2015-04-25 ENCOUNTER — Encounter: Payer: Self-pay | Admitting: *Deleted

## 2015-04-25 VITALS — BP 108/65 | HR 94 | Wt 187.0 lb

## 2015-04-25 DIAGNOSIS — Z3483 Encounter for supervision of other normal pregnancy, third trimester: Secondary | ICD-10-CM

## 2015-04-25 DIAGNOSIS — Z113 Encounter for screening for infections with a predominantly sexual mode of transmission: Secondary | ICD-10-CM | POA: Diagnosis not present

## 2015-04-25 DIAGNOSIS — Z36 Encounter for antenatal screening of mother: Secondary | ICD-10-CM | POA: Diagnosis not present

## 2015-04-25 DIAGNOSIS — Z3491 Encounter for supervision of normal pregnancy, unspecified, first trimester: Secondary | ICD-10-CM

## 2015-04-25 LAB — OB RESULTS CONSOLE GC/CHLAMYDIA: Gonorrhea: NEGATIVE

## 2015-04-25 LAB — OB RESULTS CONSOLE GBS: STREP GROUP B AG: NEGATIVE

## 2015-04-25 NOTE — Progress Notes (Signed)
Subjective:  Danielle Rubio is a 29 y.o. G2P1001 at 196w6d being seen today for ongoing prenatal care.  She is currently monitored for the following issues for this low-risk pregnancy and has Supervision of normal pregnancy in first trimester on her problem list.  Patient reports no complaints.  Contractions: Not present. Vag. Bleeding: None.  Movement: Present. Denies leaking of fluid.   The following portions of the patient's history were reviewed and updated as appropriate: allergies, current medications, past family history, past medical history, past social history, past surgical history and problem list. Problem list updated.  Objective:   Filed Vitals:   04/25/15 1442  BP: 108/65  Pulse: 94  Weight: 187 lb (84.823 kg)    Fetal Status:     Movement: Present     General:  Alert, oriented and cooperative. Patient is in no acute distress.  Skin: Skin is warm and dry. No rash noted.   Cardiovascular: Normal heart rate noted  Respiratory: Normal respiratory effort, no problems with respiration noted  Abdomen: Soft, gravid, appropriate for gestational age. Pain/Pressure: Absent     Pelvic: Vag. Bleeding: None Vag D/C Character: Thick   Cervical exam deferred        Extremities: Normal range of motion.  Edema: None  Mental Status: Normal mood and affect. Normal behavior. Normal judgment and thought content.   Urinalysis: Urine Protein: Negative Urine Glucose: Negative  Assessment and Plan:  Pregnancy: G2P1001 at 986w6d  1. Supervision of normal pregnancy in first trimester -waterbirth consent signed - Culture, beta strep (group b only) - GC/Chlamydia probe amp ([place] -Baby scripts patient--doing well.  Term labor symptoms and general obstetric precautions including but not limited to vaginal bleeding, contractions, leaking of fluid and fetal movement were reviewed in detail with the patient. Please refer to After Visit Summary for other counseling  recommendations.  Return in about 2 weeks (around 05/09/2015).   Lesly DukesKelly H Nonna Renninger, MD

## 2015-04-28 LAB — CULTURE, BETA STREP (GROUP B ONLY)

## 2015-04-30 NOTE — L&D Delivery Note (Signed)
Delivery Note At 12:55 PM a viable and healthy female was delivered via Vaginal, Spontaneous Delivery (Presentation: Left Occiput Anterior).  APGAR: 8, 9; weight pending .   Placenta status: Intact, Spontaneous.  Cord: 3 vessels with the following complications: None.  Anesthesia: None.  Local anesthetic for repair. Episiotomy: None Lacerations: 2nd degree;Perineal Suture Repair: 3.0 monocryl Est. Blood Loss (mL): 600  Mom to postpartum.  Baby to Couplet care / Skin to Skin.  Danielle Rubio is a 30 y.o. female G2P1001 with IUP at [redacted]w[redacted]d admitted for PROM with onset of labor spontaneously.  She progressed without augmentation to complete and pushed <30 minutes to deliver.  Delivery of infant's head with pt in hands and knees position in birth tub.  Pt changed position to upright but on her knees after delivery of infant head.  Shoulders slow to deliver so pt encouraged to lean back and pull her knees back, anterior/right shoulder and rest of infant delivered on next push without difficulty.  No shoulder dystocia maneuvers required, maternal repositioning assisted with easy delivery of infant.  Cord clamping delayed by several minutes then clamped by CNM and cut by FOB.  Placenta intact and spontaneous, one large gush of bleeding while pt remained in tub so cord clamped and cut prior to pt transfer to bed.  Small to moderate bleeding, with measured 400 ml of bleeding after pt moved to bed.  EBL total ~600 ml.  Pt stable, alert, bonding with infant without difficulty.  IV Pitocin started via IV which was saline locked during labor.  Bleeding minimal after Pitocin started.  2nd degree perineal laceration repaired without difficulty.  Mom and baby stable prior to transfer to postpartum. She plans on breastfeeding. She requests condoms for birth control.   [place]IR[place], Venson Ferencz 06/02/2015, 1:57 PM

## 2015-05-02 LAB — GC/CHLAMYDIA PROBE AMP (~~LOC~~) NOT AT ARMC
CHLAMYDIA, DNA PROBE: NEGATIVE
Neisseria Gonorrhea: NEGATIVE

## 2015-05-09 ENCOUNTER — Ambulatory Visit (INDEPENDENT_AMBULATORY_CARE_PROVIDER_SITE_OTHER): Payer: BC Managed Care – PPO | Admitting: Obstetrics & Gynecology

## 2015-05-09 VITALS — BP 123/69 | HR 89 | Wt 188.0 lb

## 2015-05-09 DIAGNOSIS — Z3483 Encounter for supervision of other normal pregnancy, third trimester: Secondary | ICD-10-CM

## 2015-05-09 DIAGNOSIS — Z3491 Encounter for supervision of normal pregnancy, unspecified, first trimester: Secondary | ICD-10-CM

## 2015-05-09 NOTE — Progress Notes (Signed)
Subjective:  Danielle Rubio is a 30 y.o. MW G2P1001 at 8646w6d being seen today for ongoing prenatal care.  She is currently monitored for the following issues for this low-risk pregnancy and has Supervision of normal pregnancy in first trimester on her problem list.  Patient reports no complaints.  Contractions: Irritability. Vag. Bleeding: None.  Movement: Present. Denies leaking of fluid.   The following portions of the patient's history were reviewed and updated as appropriate: allergies, current medications, past family history, past medical history, past social history, past surgical history and problem list. Problem list updated.  Objective:   Filed Vitals:   05/09/15 1550  BP: 123/69  Pulse: 89  Weight: 188 lb (85.276 kg)    Fetal Status: Fetal Heart Rate (bpm): 134   Movement: Present     General:  Alert, oriented and cooperative. Patient is in no acute distress.  Skin: Skin is warm and dry. No rash noted.   Cardiovascular: Normal heart rate noted  Respiratory: Normal respiratory effort, no problems with respiration noted  Abdomen: Soft, gravid, appropriate for gestational age. Pain/Pressure: Present     Pelvic: Vag. Bleeding: None Vag D/C Character: Thin   Cervical exam deferred        Extremities: Normal range of motion.  Edema: Mild pitting, slight indentation  Mental Status: Normal mood and affect. Normal behavior. Normal judgment and thought content.   Urinalysis: Urine Protein: Negative Urine Glucose: Negative  Assessment and Plan:  Pregnancy: G2P1001 at 6746w6d  1. Supervision of normal pregnancy in first trimester   Term labor symptoms and general obstetric precautions including but not limited to vaginal bleeding, contractions, leaking of fluid and fetal movement were reviewed in detail with the patient. Please refer to After Visit Summary for other counseling recommendations.  Return in about 1 week (around 05/16/2015).   Allie BossierMyra C Kareli Hossain, MD

## 2015-05-15 ENCOUNTER — Ambulatory Visit (INDEPENDENT_AMBULATORY_CARE_PROVIDER_SITE_OTHER): Payer: BC Managed Care – PPO | Admitting: Obstetrics & Gynecology

## 2015-05-15 VITALS — BP 115/71 | HR 96 | Wt 189.0 lb

## 2015-05-15 DIAGNOSIS — Z3491 Encounter for supervision of normal pregnancy, unspecified, first trimester: Secondary | ICD-10-CM

## 2015-05-15 DIAGNOSIS — Z3483 Encounter for supervision of other normal pregnancy, third trimester: Secondary | ICD-10-CM

## 2015-05-15 NOTE — Progress Notes (Signed)
Baby scripts compliant 

## 2015-05-15 NOTE — Progress Notes (Signed)
Subjective:  Danielle Rubio is a 30 y.o. G2P1001 at 341w5d being seen today for ongoing prenatal care.  She is currently monitored for the following issues for this low-risk pregnancy and has Supervision of normal pregnancy in first trimester on her problem list.  Patient reports backache, fatigue and sciatica pain.  Contractions: Irregular. Vag. Bleeding: None.  Movement: Present. Denies leaking of fluid.   The following portions of the patient's history were reviewed and updated as appropriate: allergies, current medications, past family history, past medical history, past social history, past surgical history and problem list. Problem list updated.  Objective:   Filed Vitals:   05/15/15 1404  BP: 115/71  Pulse: 96  Weight: 189 lb (85.73 kg)    Fetal Status: Fetal Heart Rate (bpm): 139 Fundal Height: 38 cm Movement: Present     General:  Alert, oriented and cooperative. Patient is in no acute distress.  Skin: Skin is warm and dry. No rash noted.   Cardiovascular: Normal heart rate noted  Respiratory: Normal respiratory effort, no problems with respiration noted  Abdomen: Soft, gravid, appropriate for gestational age. Pain/Pressure: Present     Pelvic: Vag. Bleeding: None Vag D/C Character: Thin   Cervical exam deferred        Extremities: Normal range of motion.  Edema: None  Mental Status: Normal mood and affect. Normal behavior. Normal judgment and thought content.   Urinalysis: Urine Protein: Negative Urine Glucose: Negative  Assessment and Plan:  Pregnancy: G2P1001 at [redacted]w[redacted]d  1. Supervision of normal pregnancy in first trimester Pt has much more discomfort after working.  Will re evaluate next week and write out of work if discomfort and fatigue are interfering with ability to work effectively.   Baby scripts--compliant Pt defers cervical exam 2x weekly testing if goes beyond 40 weeks   Term labor symptoms and general obstetric precautions including but not limited to  vaginal bleeding, contractions, leaking of fluid and fetal movement were reviewed in detail with the patient. Please refer to After Visit Summary for other counseling recommendations.  Return in about 1 week (around 05/22/2015).   Lesly DukesKelly H Derold Dorsch, MD

## 2015-05-23 ENCOUNTER — Ambulatory Visit (INDEPENDENT_AMBULATORY_CARE_PROVIDER_SITE_OTHER): Payer: BC Managed Care – PPO | Admitting: Obstetrics & Gynecology

## 2015-05-23 ENCOUNTER — Encounter: Payer: Self-pay | Admitting: *Deleted

## 2015-05-23 VITALS — BP 120/79 | HR 102 | Wt 194.0 lb

## 2015-05-23 DIAGNOSIS — Z3481 Encounter for supervision of other normal pregnancy, first trimester: Secondary | ICD-10-CM

## 2015-05-23 DIAGNOSIS — Z3491 Encounter for supervision of normal pregnancy, unspecified, first trimester: Secondary | ICD-10-CM

## 2015-05-23 NOTE — Progress Notes (Signed)
Subjective:  Danielle Rubio is a 30 y.o. G2P1001 at [redacted]w[redacted]d being seen today for ongoing prenatal care.  She is currently monitored for the following issues for this low-risk pregnancy and has Supervision of normal pregnancy in first trimester on her problem list.  Patient reports pelvic presure.  Contractions: Irregular. Vag. Bleeding: None.  Movement: Present. Denies leaking of fluid.   The following portions of the patient's history were reviewed and updated as appropriate: allergies, current medications, past family history, past medical history, past social history, past surgical history and problem list. Problem list updated.  Objective:   Filed Vitals:   05/23/15 1556  BP: 120/79  Pulse: 102  Weight: 194 lb (87.998 kg)    Fetal Status: Fetal Heart Rate (bpm): 126 Fundal Height: 38 cm Movement: Present  Presentation: Vertex  General:  Alert, oriented and cooperative. Patient is in no acute distress.  Skin: Skin is warm and dry. No rash noted.   Cardiovascular: Normal heart rate noted  Respiratory: Normal respiratory effort, no problems with respiration noted  Abdomen: Soft, gravid, appropriate for gestational age. Pain/Pressure: Present     Pelvic: Vag. Bleeding: None Vag D/C Character: Thin   Cervical exam deferred        Extremities: Normal range of motion.  Edema: None  Mental Status: Normal mood and affect. Normal behavior. Normal judgment and thought content.   Urinalysis:      Assessment and Plan:  Pregnancy: G2P1001 at [redacted]w[redacted]d  There are no diagnoses linked to this encounter. Term labor symptoms and general obstetric precautions including but not limited to vaginal bleeding, contractions, leaking of fluid and fetal movement were reviewed in detail with the patient. Please refer to After Visit Summary for other counseling recommendations.   5 pound weight gain in 1 week.  Pt to take BP daily. PIH precautions reviewed. BP nml today. Pt written out of work due to  fatigue and back pain and at due date.  Return in about 1 week (around 05/30/2015).   Lesly Dukes, MD

## 2015-05-30 ENCOUNTER — Ambulatory Visit (INDEPENDENT_AMBULATORY_CARE_PROVIDER_SITE_OTHER): Payer: BC Managed Care – PPO | Admitting: Obstetrics & Gynecology

## 2015-05-30 VITALS — BP 127/78 | HR 89 | Wt 195.0 lb

## 2015-05-30 DIAGNOSIS — Z3483 Encounter for supervision of other normal pregnancy, third trimester: Secondary | ICD-10-CM

## 2015-05-30 DIAGNOSIS — O480 Post-term pregnancy: Secondary | ICD-10-CM

## 2015-05-30 NOTE — Progress Notes (Signed)
Subjective:  Danielle Rubio is a 30 y.o. G2P1001 at [redacted]w[redacted]d being seen today for ongoing prenatal care.  She is currently monitored for the following issues for this low-risk pregnancy and has Supervision of normal pregnancy in first trimester on her problem list.  Patient reports no complaints.  Contractions: Irritability. Vag. Bleeding: None.  Movement: Present. Denies leaking of fluid.   The following portions of the patient's history were reviewed and updated as appropriate: allergies, current medications, past family history, past medical history, past social history, past surgical history and problem list. Problem list updated.  Objective:   Filed Vitals:   05/30/15 1511  BP: 127/78  Pulse: 89  Weight: 195 lb (88.451 kg)    Fetal Status:     Movement: Present     General:  Alert, oriented and cooperative. Patient is in no acute distress.  Skin: Skin is warm and dry. No rash noted.   Cardiovascular: Normal heart rate noted  Respiratory: Normal respiratory effort, no problems with respiration noted  Abdomen: Soft, gravid, appropriate for gestational age. Pain/Pressure: Present     Pelvic: Vag. Bleeding: None     Cervical exam deferred        Extremities: Normal range of motion.  Edema: None  Mental Status: Normal mood and affect. Normal behavior. Normal judgment and thought content.   Urinalysis:      Assessment and Plan:  Pregnancy: G2P1001 at [redacted]w[redacted]d  There are no diagnoses linked to this encounter. Term labor symptoms and general obstetric precautions including but not limited to vaginal bleeding, contractions, leaking of fluid and fetal movement were reviewed in detail with the patient. Please refer to After Visit Summary for other counseling recommendations.  Return in about 3 days (around 06/02/2015).   Lesly Dukes, MD

## 2015-05-31 ENCOUNTER — Encounter: Payer: Self-pay | Admitting: *Deleted

## 2015-06-01 ENCOUNTER — Inpatient Hospital Stay (HOSPITAL_COMMUNITY)
Admission: AD | Admit: 2015-06-01 | Discharge: 2015-06-03 | DRG: 775 | Disposition: A | Discharge status: Home / Self Care | Payer: BC Managed Care – PPO | Source: Ambulatory Visit | Attending: Family Medicine | Admitting: Family Medicine

## 2015-06-01 ENCOUNTER — Encounter (HOSPITAL_COMMUNITY): Payer: Self-pay

## 2015-06-01 DIAGNOSIS — Z8052 Family history of malignant neoplasm of bladder: Secondary | ICD-10-CM

## 2015-06-01 DIAGNOSIS — Z800 Family history of malignant neoplasm of digestive organs: Secondary | ICD-10-CM

## 2015-06-01 DIAGNOSIS — Z3A41 41 weeks gestation of pregnancy: Secondary | ICD-10-CM

## 2015-06-01 DIAGNOSIS — O4292 Full-term premature rupture of membranes, unspecified as to length of time between rupture and onset of labor: Secondary | ICD-10-CM | POA: Diagnosis present

## 2015-06-01 DIAGNOSIS — Z820 Family history of epilepsy and other diseases of the nervous system: Secondary | ICD-10-CM | POA: Diagnosis not present

## 2015-06-01 DIAGNOSIS — Z3491 Encounter for supervision of normal pregnancy, unspecified, first trimester: Secondary | ICD-10-CM

## 2015-06-01 DIAGNOSIS — O480 Post-term pregnancy: Secondary | ICD-10-CM | POA: Diagnosis present

## 2015-06-01 DIAGNOSIS — O4290 Premature rupture of membranes, unspecified as to length of time between rupture and onset of labor, unspecified weeks of gestation: Secondary | ICD-10-CM | POA: Diagnosis present

## 2015-06-01 LAB — CBC
HCT: 34.3 % — ABNORMAL LOW (ref 36.0–46.0)
HEMOGLOBIN: 11.6 g/dL — AB (ref 12.0–15.0)
MCH: 30.3 pg (ref 26.0–34.0)
MCHC: 33.8 g/dL (ref 30.0–36.0)
MCV: 89.6 fL (ref 78.0–100.0)
PLATELETS: 181 10*3/uL (ref 150–400)
RBC: 3.83 MIL/uL — AB (ref 3.87–5.11)
RDW: 13.4 % (ref 11.5–15.5)
WBC: 10.3 10*3/uL (ref 4.0–10.5)

## 2015-06-01 LAB — TYPE AND SCREEN
ABO/RH(D): A POS
Antibody Screen: NEGATIVE

## 2015-06-01 LAB — AMNISURE RUPTURE OF MEMBRANE (ROM) NOT AT ARMC: AMNISURE: POSITIVE

## 2015-06-01 LAB — ABO/RH: ABO/RH(D): A POS

## 2015-06-01 MED ORDER — FLEET ENEMA 7-19 GM/118ML RE ENEM: 1.0000 | ENEMA | RECTAL | Status: DC | PRN

## 2015-06-01 MED ORDER — LACTATED RINGERS IV SOLN: INTRAVENOUS | Status: DC

## 2015-06-01 MED ORDER — CITRIC ACID-SODIUM CITRATE 334-500 MG/5ML PO SOLN: 30.0000 mL | ORAL | Status: DC | PRN

## 2015-06-01 MED ORDER — LIDOCAINE HCL (PF) 1 % IJ SOLN
30.0000 mL | INTRAMUSCULAR | Status: DC | PRN
  Administered 2015-06-02: 30 mL via SUBCUTANEOUS
  Filled 2015-06-01: qty 30

## 2015-06-01 MED ORDER — LACTATED RINGERS IV SOLN: 500.0000 mL | INTRAVENOUS | Status: DC | PRN

## 2015-06-01 MED ORDER — SODIUM CHLORIDE 0.9% FLUSH: 3.0000 mL | INTRAVENOUS | Status: DC | PRN

## 2015-06-01 MED ORDER — LACTATED RINGERS IV SOLN
2.5000 [IU]/h | INTRAVENOUS | Status: DC
  Administered 2015-06-02: 2.5 [IU]/h via INTRAVENOUS
  Filled 2015-06-01: qty 4

## 2015-06-01 MED ORDER — OXYTOCIN BOLUS FROM INFUSION
500.0000 mL | INTRAVENOUS | Status: DC
  Administered 2015-06-02: 500 mL via INTRAVENOUS

## 2015-06-01 MED ORDER — ACETAMINOPHEN 325 MG PO TABS: 650.0000 mg | ORAL_TABLET | ORAL | Status: DC | PRN

## 2015-06-01 MED ORDER — SODIUM CHLORIDE 0.9% FLUSH
3.0000 mL | Freq: Two times a day (BID) | INTRAVENOUS | Status: DC
  Administered 2015-06-01: 3 mL via INTRAVENOUS

## 2015-06-01 MED ORDER — MISOPROSTOL 200 MCG PO TABS
50.0000 ug | ORAL_TABLET | ORAL | Status: DC
Start: 2015-06-02 — End: 2015-06-02

## 2015-06-01 MED ORDER — SODIUM CHLORIDE 0.9 % IV SOLN: 250.0000 mL | INTRAVENOUS | Status: DC | PRN

## 2015-06-01 MED ORDER — ONDANSETRON HCL 4 MG/2ML IJ SOLN: 4.0000 mg | Freq: Four times a day (QID) | INTRAMUSCULAR | Status: DC | PRN

## 2015-06-01 NOTE — Progress Notes (Signed)
Per Cresenzo-Dishmon, CNM, RN may administer dose of cytotec prior to scheduled time of 0300 if patient desires augmentation sooner.

## 2015-06-01 NOTE — MAU Note (Signed)
ROM at 0245, contraction

## 2015-06-01 NOTE — Progress Notes (Addendum)
Not really contracting per pt.  Has tried nipple stim w/results, but ctx stop when she stops.  FHR Cat 1 per last monitoring. Discussed IOL--pt agrees to start PO cytotec at 0300 (24 houirs after PROM) if not in labor.   Filed Vitals:   06/01/15 1711 06/01/15 1933  BP: 110/70 119/78  Pulse: 93 102  Temp:  98.1 F (36.7 C)  Resp: 18 18

## 2015-06-01 NOTE — Progress Notes (Signed)
   Danielle Rubio is a 30 y.o. G2P1001 at [redacted]w[redacted]d  admitted for PROM  Subjective:  Says contractions are getting closer and more intense Objective: Filed Vitals:   06/01/15 1711 06/01/15 1933 06/01/15 2052 06/01/15 2123  BP: 110/70 119/78    Pulse: 93 102    Temp:  98.1 F (36.7 C)  98.2 F (36.8 C)  TempSrc:  Oral  Oral  Resp: Height:      Weight:      SpO2:          FHT:  FHR: 150 bpm, variability: moderate,  accelerations:  Present,  decelerations:  Absent UC:   irregular, every 4-10 minutes SVE:   Dilation: 3 Effacement (%): Thick Exam by:: Lawson Fiscal CNM   Labs: Lab Results  Component Value Date   WBC 10.3 06/01/2015   HGB 11.6* 06/01/2015   HCT 34.3* 06/01/2015   MCV 89.6 06/01/2015   PLT 181 06/01/2015    Assessment / Plan: PROM, increasing ctx Will start Cytotec at 0300 if not in labor Labor: no Fetal Wellbeing:  Category I Pain Control:  Labor support without medications Anticipated MOD:  NSVD  CRESENZO-DISHMAN,Kayne Yuhas 06/01/2015, 9:33 PM

## 2015-06-01 NOTE — H&P (Signed)
LABOR ADMISSION HISTORY AND PHYSICAL  Danielle Rubio is a 30 y.o. female G2P1001 with IUP at [redacted]w[redacted]d by LMP and 7 wk [place] presenting for PROM. She reports +FM, + contractions, No LOF, no VB, no blurry vision, headaches or peripheral edema, and RUQ pain.  She plans on breast feeding. She request condoms for birth control.  Dating: By LMP and 7wk [place] --->  Estimated Date of Delivery: 05/24/15  Sono:    , limited anatomy survery (cavum, nuchal fold, heart, RVOT, aortic arch, abdominal wall not well visualized, 228g, 29% EFW @ 21ww6d. Limited anatomy survey 474g  30% EFW   Prenatal History/Complications: None Reports no complications with previous pregnancy/labor/delivery/nursery stay   Past Medical History: Past Medical History  Diagnosis Date  . Medical history non-contributory     Past Surgical History: Past Surgical History  Procedure Laterality Date  . Wisdom tooth extraction    . No past surgeries      Obstetrical History: OB History    Gravida Para Term Preterm AB TAB SAB Ectopic Multiple Living   Social History: Social History   Social History  . Marital Status: Married    Spouse Name: N/A  . Number of Children: N/A  . Years of Education: N/A   Social History Main Topics  . Smoking status: Never Smoker   . Smokeless tobacco: Never Used  . Alcohol Use: No  . Drug Use: No  . Sexual Activity: Yes    Birth Control/ Protection: None   Other Topics Concern  . None   Social History Narrative    Family History: Family History  Problem Relation Age of Onset  . Cancer Maternal Aunt     bladder cancer  . Alzheimer's disease Maternal Grandmother   . Cancer Maternal Grandfather     pancreatic    Allergies: Allergies  Allergen Reactions  . Lac Bovis Itching    Acne  . Gluten Meal Other (See Comments)    GI effects    Prescriptions prior to admission  Medication Sig Dispense Refill Last Dose  . Prenatal Vit-Fe Fumarate-FA  (PRENATAL MULTIVITAMIN) TABS tablet Take 1 tablet by mouth daily at 12 noon.   05/31/2015 at Unknown time     Review of Systems   All systems reviewed and negative except as stated in HPI  BP 125/85 mmHg  Pulse 98  Temp(Src) 97.9 F (36.6 C) (Oral)  Resp 16  Ht  (1.676 m)  Wt 88.451 kg (195 lb)  BMI 31.49 kg/m2  SpO2 99%  LMP 08/17/2014 General appearance: alert and cooperative Lungs: clear to auscultation bilaterally Heart: regular rate and rhythm Abdomen: soft, non-tender; bowel sounds normal Extremities: Homans sign is negative, no sign of DVT, edema Fetal monitoringBaseline: 130 bpm, Variability: Good {> 6 bpm), Accelerations: Reactive and Decelerations: Absent (in MAU) Uterine activity: patient does not feel any    Prenatal labs: ABO, Rh: A/POS/-- (06/13 1004) Antibody: NEG (06/13 1004) Rubella: Immune  RPR: NON REAC (10/31 1614)  HBsAg: NEGATIVE (06/13 1004)  HIV: NONREACTIVE (10/31 1614)  GBS:    1 hr Glucola 90 Genetic screening  declined Anatomy US: normal but limited views lip, palate, heart (after 2 attempts), patient opts no further [place]  Prenatal Transfer Tool  Maternal Diabetes: No Genetic Screening: Declined Maternal Ultrasounds/Referrals: Normal; but limited views lip, palate, heart (after 2 attempts), patient opts no further US Fetal Ultrasounds or other Referrals:  None Maternal Substance Abuse:  No Significant Maternal Medications:  None Significant Maternal Lab Results: Lab values include: Group B Strep negative  Results for orders placed or performed during the hospital encounter of 06/01/15 (from the past 24 hour(s))  Amnisure rupture of membrane (rom)not at [facility]   Collection Time: 06/01/15  8:13 AM  Result Value Ref Range   Amnisure ROM POSITIVE     Patient Active Problem List   Diagnosis Date Noted  . PROM (premature rupture of membranes) 06/01/2015  . Supervision of normal pregnancy in first trimester 10/10/2014     Assessment: Danielle Rubio is a 30 y.o. G2P1001 at [redacted]w[redacted]d here for PROM   #Labor: Patient desires water birth #Pain: does not desire medications for pain  #FWB: Cat 1; intermittent monitoring as patient desires water birth #ID: GBS neg #MOF: breast #MOC: condoms #Circ: n/a  Palma Holter, MD PGY 1 Family Medicine      CNM attestation:  I have seen and examined this patient; I agree with above documentation in the Resident's note.    Danielle Rubio, CNM 2:30 PM

## 2015-06-02 ENCOUNTER — Encounter (HOSPITAL_COMMUNITY): Payer: Self-pay | Admitting: Family Medicine

## 2015-06-02 ENCOUNTER — Other Ambulatory Visit: Payer: BC Managed Care – PPO

## 2015-06-02 DIAGNOSIS — Z3A41 41 weeks gestation of pregnancy: Secondary | ICD-10-CM

## 2015-06-02 DIAGNOSIS — O4292 Full-term premature rupture of membranes, unspecified as to length of time between rupture and onset of labor: Secondary | ICD-10-CM

## 2015-06-02 LAB — RPR: RPR Ser Ql: NONREACTIVE

## 2015-06-02 MED ORDER — ACETAMINOPHEN 325 MG PO TABS: 650.0000 mg | ORAL_TABLET | ORAL | Status: DC | PRN

## 2015-06-02 MED ORDER — ONDANSETRON HCL 4 MG/2ML IJ SOLN: 4.0000 mg | INTRAMUSCULAR | Status: DC | PRN

## 2015-06-02 MED ORDER — DIBUCAINE 1 % RE OINT: 1.0000 "application " | TOPICAL_OINTMENT | RECTAL | Status: DC | PRN

## 2015-06-02 MED ORDER — SIMETHICONE 80 MG PO CHEW: 80.0000 mg | CHEWABLE_TABLET | ORAL | Status: DC | PRN

## 2015-06-02 MED ORDER — BENZOCAINE-MENTHOL 20-0.5 % EX AERO
1.0000 "application " | INHALATION_SPRAY | CUTANEOUS | Status: DC | PRN
  Administered 2015-06-02: 1 via TOPICAL
  Filled 2015-06-02: qty 56

## 2015-06-02 MED ORDER — TETANUS-DIPHTH-ACELL PERTUSSIS 5-2.5-18.5 LF-MCG/0.5 IM SUSP: 0.5000 mL | Freq: Once | INTRAMUSCULAR | Status: DC

## 2015-06-02 MED ORDER — ONDANSETRON HCL 4 MG PO TABS: 4.0000 mg | ORAL_TABLET | ORAL | Status: DC | PRN

## 2015-06-02 MED ORDER — ZOLPIDEM TARTRATE 5 MG PO TABS: 5.0000 mg | ORAL_TABLET | Freq: Every evening | ORAL | Status: DC | PRN

## 2015-06-02 MED ORDER — PRENATAL MULTIVITAMIN CH: 1.0000 | ORAL_TABLET | Freq: Every day | ORAL | Status: DC

## 2015-06-02 MED ORDER — SENNOSIDES-DOCUSATE SODIUM 8.6-50 MG PO TABS
2.0000 | ORAL_TABLET | ORAL | Status: DC
  Filled 2015-06-02: qty 2

## 2015-06-02 MED ORDER — DIPHENHYDRAMINE HCL 25 MG PO CAPS: 25.0000 mg | ORAL_CAPSULE | Freq: Four times a day (QID) | ORAL | Status: DC | PRN

## 2015-06-02 MED ORDER — LANOLIN HYDROUS EX OINT: TOPICAL_OINTMENT | CUTANEOUS | Status: DC | PRN

## 2015-06-02 MED ORDER — IBUPROFEN 600 MG PO TABS
600.0000 mg | ORAL_TABLET | Freq: Four times a day (QID) | ORAL | Status: DC
Start: 2015-06-02 — End: 2015-06-03
  Filled 2015-06-02 (×2): qty 1

## 2015-06-02 MED ORDER — WITCH HAZEL-GLYCERIN EX PADS
1.0000 | MEDICATED_PAD | CUTANEOUS | Status: DC | PRN
Start: 2015-06-02 — End: 2015-06-03

## 2015-06-02 NOTE — Progress Notes (Signed)
Wants to get in tub.  Ctx q 2-3 minutes.  Cx 6-7/100/0.  Ok to get in tub.

## 2015-06-02 NOTE — Progress Notes (Signed)
   Danielle Rubio is a 30 y.o. G2P1001 at [redacted]w[redacted]d  admitted for PROM  Subjective: Ctx are q 2-3 m inutes and stronger Objective: Filed Vitals:   06/01/15 2123 06/01/15 2247 06/02/15 0230 06/02/15 0435  BP:  121/67 116/69   Pulse:  95 100   Temp: 98.2 F (36.8 C) 98.1 F (36.7 C) 97.9 F (36.6 C) 97.5 F (36.4 C)  TempSrc: Oral Oral Oral Oral  Resp: 18 18    Height:      Weight:      SpO2:       Total I/O In: 3 [I.V.:3] Out: -   FHT:  FHR: 145 bpm, variability: moderate,  accelerations:  Present,  decelerations:  Absent NST's q 2 hours UC:   regular, every 2-3 minutes SVE:   deferred   Labs: Lab Results  Component Value Date   WBC 10.3 06/01/2015   HGB 11.6* 06/01/2015   HCT 34.3* 06/01/2015   MCV 89.6 06/01/2015   PLT 181 06/01/2015    Assessment / Plan: PROM, hopefully starting to labor.  Is contracting too often for cytotec.  Labor: maybe Fetal Wellbeing:  Category I Pain Control:  Labor support without medications Anticipated MOD:  NSVD  CRESENZO-DISHMAN,Chandler Stofer 06/02/2015, 6:39 AM

## 2015-06-02 NOTE — Progress Notes (Signed)
Patient ID: Danielle Rubio, female   DOB: Oct 30, 1985, 30 y.o.   MRN: [id number] Feeling more pressure all of the time now, but not an urge to push FHTs 138 Cx 8/100/0  Will warm tub up and then get back in Anticipate SVD  Cam Hai 06/02/2015 9:49 AM

## 2015-06-02 NOTE — Progress Notes (Signed)
Patient ID: Danielle Rubio, female   DOB: 1985-05-22, 30 y.o.   MRN: [id number]  Pt in tub; coping well w/ ctx FHT 140s, +accels, no decels w/ intermittent monitoring Ctx q 2-3 mins per pt  IUP@term  Active labor Planning waterbirth  Continue support Anticipate SVD  Cam Hai 06/02/2015 9:35 AM

## 2015-06-02 NOTE — Lactation Note (Signed)
This note was copied from the chart of Danielle [employer]. Lactation Consultation Note  Patient Name: Danielle Rubio [id number] Date: 06/02/2015 Reason for consult: Initial assessment Baby at 6 hr of life and mom reports baby is bf well. She stated that her older child had trouble latching but she feels like this baby's latch is good. Denies breast or nipple pain. No concerns voiced. She stated that she can manually express and has a spoon in the room. Discussed baby behavior, feeding frequency, baby belly size, voids, wt loss, breast changes, and nipple care. Given lactation handouts. Aware of OP services and support group.    Maternal Data Has patient been taught Hand Expression?: Yes Does the patient have breastfeeding experience prior to this delivery?: Yes  Feeding Feeding Type: Breast Fed Length of feed: 10 min  LATCH Score/Interventions Latch: Repeated attempts needed to sustain latch, nipple held in mouth throughout feeding, stimulation needed to elicit sucking reflex. Intervention(s): Adjust position;Assist with latch;Breast compression  Audible Swallowing: Spontaneous and intermittent Intervention(s): Skin to skin;Hand expression;Alternate breast massage  Type of Nipple: Everted at rest and after stimulation  Comfort (Breast/Nipple): Soft / non-tender     Hold (Positioning): No assistance needed to correctly position infant at breast.  LATCH Score: 9  Lactation Tools Discussed/Used     Consult Status Consult Status: Follow-up Date: 06/03/15 Follow-up type: In-patient    Rulon Eisenmenger 06/02/2015, 7:43 PM

## 2015-06-02 NOTE — Progress Notes (Signed)
Danielle Rubio is a 30 y.o. G2P1001 at [redacted]w[redacted]d by LMP c/w 1st trimester [place] admitted for PROM with onset of active labor  Subjective: Pt managing contractions well in birth tub, s/o and mother in room for support.  Reports feeling some constant pressure, no urge to push at this time.  Objective: BP 119/67 mmHg  Pulse 96  Temp(Src) 97.9 F (36.6 C) (Oral)  Resp 18  Ht  (1.676 m)  Wt 88.451 kg (195 lb)  BMI 31.49 kg/m2  SpO2 99%  LMP 08/17/2014 I/O last 3 completed shifts: In: 3 [I.V.:3] Out: -     FHT:  Intermittent doppler in active labor per protocol with normal FHR, no decelerations noted UC:   regular, every 3-5 minutes SVE:   Dilation: 8 Effacement (%): 100 Station: 0 Exam by:: Philipp Deputy CNM  Labs: Lab Results  Component Value Date   WBC 10.3 06/01/2015   HGB 11.6* 06/01/2015   HCT 34.3* 06/01/2015   MCV 89.6 06/01/2015   PLT 181 06/01/2015    Assessment / Plan: Spontaneous labor, progressing normally  Labor: Progressing normally Preeclampsia:  n/a Fetal Wellbeing:  Category I Pain Control:  Labor support without medications .  Water immersion for pain management. I/D:  n/a Anticipated MOD:  NSVD  LEFTWICH-KIRBY, Chord Takahashi 06/02/2015, 12:25 PM

## 2015-06-02 NOTE — Lactation Note (Signed)
This note was copied from the chart of Girl [employer]. Lactation Consultation Note  Patient Name: Girl Quadasia Newsham [id number] Date: 06/02/2015 Reason for consult: Initial assessment Baby at 6 hr of life and mom was resting. Left handouts and instructions for mom to call at next feeding.   Maternal Data    Feeding Feeding Type: Breast Milk Length of feed: 23 min  LATCH Score/Interventions Latch: Grasps breast easily, tongue down, lips flanged, rhythmical sucking.  Audible Swallowing: A few with stimulation Intervention(s): Skin to skin  Type of Nipple: Everted at rest and after stimulation  Comfort (Breast/Nipple): Soft / non-tender     Hold (Positioning): No assistance needed to correctly position infant at breast.  LATCH Score: 9  Lactation Tools Discussed/Used     Consult Status Consult Status: Follow-up Date: 06/02/15 Follow-up type: In-patient    Rulon Eisenmenger 06/02/2015, 7:16 PM

## 2015-06-03 LAB — CBC
HEMATOCRIT: 25 % — AB (ref 36.0–46.0)
Hemoglobin: 8.4 g/dL — ABNORMAL LOW (ref 12.0–15.0)
MCH: 29.6 pg (ref 26.0–34.0)
MCHC: 33.6 g/dL (ref 30.0–36.0)
MCV: 88 fL (ref 78.0–100.0)
PLATELETS: 161 10*3/uL (ref 150–400)
RBC: 2.84 MIL/uL — ABNORMAL LOW (ref 3.87–5.11)
RDW: 13.4 % (ref 11.5–15.5)
WBC: 13 10*3/uL — ABNORMAL HIGH (ref 4.0–10.5)

## 2015-06-03 MED ORDER — IBUPROFEN 600 MG PO TABS: 600.0000 mg | ORAL_TABLET | Freq: Four times a day (QID) | ORAL | Status: DC | PRN

## 2015-06-03 NOTE — Lactation Note (Signed)
This note was copied from the chart of Danielle [employer]. Lactation Consultation Note  Patient Name: Danielle Rubio [id number] Date: 06/03/2015 Reason for consult: Follow-up assessment Baby is 21 hours old,  According to doc flow sheets , baby had not voided,  LC reviewed feeding diary sheet with parents and come to find baby voided  last evening with a stool. And also had a large wet at this consult.  Sore nipple and engorgement prevention and tx .  Per mom has a DEBP at home.  Baby awake and rooting, LC assisted mom with depth and positioning and review of basics.  When baby was  Latched with depth in the football position , multiply swallows noted, increased with breast compressions.  Per mom the baby has been cluster feeding.  LC recommended not to allow baby to go over 3 hours without offering the breast, also discussed babies having their night and days  Switched around. See Doc flow sheets for latch score.  Mother informed of post-discharge support and given phone number to the lactation department, including services for phone call assistance; out-patient appointments; and breastfeeding support group. List of other breastfeeding resources in the community given in the handout. Encouraged mother to call for problems or concerns related to breastfeeding.  Maternal Data Has patient been taught Hand Expression?: Yes  Feeding Feeding Type: Breast Fed Length of feed: 15 min (per mom )  LATCH Score/Interventions Latch: Grasps breast easily, tongue down, lips flanged, rhythmical sucking. Intervention(s): Adjust position;Assist with latch;Breast massage;Breast compression  Audible Swallowing: Spontaneous and intermittent  Type of Nipple: Everted at rest and after stimulation  Comfort (Breast/Nipple): Soft / non-tender     Hold (Positioning): Assistance needed to correctly position infant at breast and maintain latch. Intervention(s): Breastfeeding basics reviewed;Support  Pillows;Position options;Skin to skin  LATCH Score: 9  Lactation Tools Discussed/Used WIC Program: No   Consult Status Consult Status: Complete Date: 06/03/15 Follow-up type: In-patient    Kathrin Greathouse 06/03/2015, 10:57 AM

## 2015-06-03 NOTE — Discharge Instructions (Signed)

## 2015-06-03 NOTE — Discharge Summary (Signed)
OB Discharge Summary     Patient Name: Danielle Rubio DOB: Jun 24, 1985 MRN: [id number]  Date of admission: 06/01/2015 Delivering MD: Sharen Counter A   Date of discharge: 06/03/2015  Admitting diagnosis: 41 WEEKS ROM Intrauterine pregnancy: [redacted]w[redacted]d     Secondary diagnosis:  Active Problems:   PROM (premature rupture of membranes)   NSVD (normal spontaneous vaginal delivery)  Additional problems: none     Discharge diagnosis: Term Pregnancy Delivered                                                                                                Post partum procedures:none  Augmentation: none  Complications: None  Hospital course:  Onset of Labor With Vaginal Delivery     30 y.o. yo [name] at [redacted]w[redacted]d was admitted in Latent Labor on 06/01/2015. She had PROM, but started laboring spontaneously by early AM on 2/3. Patient had an uncomplicated labor course as follows:  Membrane Rupture Time/Date: 2:45 AM ,06/01/2015   Intrapartum Procedures: Episiotomy: None [1]                                         Lacerations:  2nd degree [3];Perineal [11]  Patient had a delivery of a Viable infant in the tub. 06/02/2015  Information for the patient's newborn:  Danielle, Rubio [[id number]]  Delivery Method: Vaginal, Spontaneous Delivery (Filed from Delivery Summary)    Pateint had an uncomplicated postpartum course.  She is ambulating, tolerating a regular diet, passing flatus, and urinating well. Patient is discharged home in stable condition on 06/03/2015.    Physical exam  Filed Vitals:   06/02/15 1450 06/02/15 1555 06/02/15 2130 06/03/15 0530  BP: 128/71 121/72 117/71 118/66  Pulse: 87 100 101 91  Temp: 98.4 F (36.9 C) 98.8 F (37.1 C) 98.7 F (37.1 C) 98.4 F (36.9 C)  TempSrc: Oral Oral Oral Oral  Resp: Height:      Weight:      SpO2: 98% 97% 98%    General: alert and cooperative Lochia: appropriate Uterine Fundus: firm Incision: N/A DVT Evaluation: No  evidence of DVT seen on physical exam. Labs: Lab Results  Component Value Date   WBC 13.0* 06/03/2015   HGB 8.4* 06/03/2015   HCT 25.0* 06/03/2015   MCV 88.0 06/03/2015   PLT 161 06/03/2015   No flowsheet data found.  Discharge instruction: per After Visit Summary and "Baby and Me Booklet".  After visit meds:    Medication List    TAKE these medications        ibuprofen 600 MG tablet  Commonly known as:  ADVIL,MOTRIN  Take 1 tablet (600 mg total) by mouth every 6 (six) hours as needed.     prenatal multivitamin Tabs tablet  Take 1 tablet by mouth daily at 12 noon.        Diet: routine diet  Activity: Advance as tolerated. Pelvic rest for 6 weeks.   Outpatient follow up:6 weeks Follow up Appt:No  future appointments. Follow up Visit:No Follow-up on file.  Postpartum contraception: Condoms  Newborn Data: Live born female  Birth Weight: 9 lb 8 oz (4309 g) APGAR: 8, 9  Baby Feeding: Breast Disposition:home with mother   06/03/2015 Cam Hai, CNM  9:19 AM

## 2015-07-14 ENCOUNTER — Encounter: Payer: Self-pay | Admitting: Family

## 2015-07-14 ENCOUNTER — Ambulatory Visit (INDEPENDENT_AMBULATORY_CARE_PROVIDER_SITE_OTHER): Payer: BC Managed Care – PPO | Admitting: Family

## 2015-07-14 NOTE — Progress Notes (Signed)
Patient ID: Danielle Rubio, female   DOB: 1985-12-28, 30 y.o.   MRN: [id number] Post Partum Exam  Danielle Rubio is a 30 y.o. 642P2002 female who presents for a postpartum visit. She is 6 weeks postpartum following a spontaneous vaginal delivery. I have fully reviewed the prenatal and intrapartum course. The delivery was at 5928w3d gestational weeks.  Anesthesia: none. Postpartum course has been unremarkable. Baby's course has been unremarkable. Baby is feeding by breast. Bleeding no bleeding. Bleeding lasted approximately two weeks.  Bowel function is normal. Bladder function is normal. Patient is sexually active. Contraception method is condoms. Postpartum depression screening: negative.  The following portions of the patient's history were reviewed and updated as appropriate: allergies, current medications, past family history, past medical history, past social history, past surgical history and problem list.  Review of Systems Pertinent items are noted in HPI.   Objective:    BP 116/78 mmHg  Pulse 78  Resp 16  Ht 5\' 5"  (1.651 m)  Wt 211 lb (95.709 kg)  BMI 35.11 kg/m2  Breastfeeding? Yes   General:  alert, cooperative and appears stated age   Breasts:  inspection negative, no nipple discharge or bleeding, no masses or nodularity palpable  Lungs: clear to auscultation bilaterally  Heart:  regular rate and rhythm, S1, S2 normal, no murmur, click, rub or gallop  Abdomen: soft, non-tender; bowel sounds normal; no masses,  no organomegaly   Vulva:  normal  Vagina: normal vagina, no discharge, exudate, lesion, or erythema; healed well  Cervix:  no cervical motion tenderness  Corpus: normal size, contour, position, consistency, mobility, non-tender  Adnexa:  normal adnexa  Rectal Exam: Not performed.        Assessment:    Normal postpartum exam. Pap smear not done at today's visit.   Plan:    1. Contraception: condoms 2. Follow up as needed.   Eino FarberWalidah Kennith GainN Karim, CNM

## 2016-04-29 NOTE — L&D Delivery Note (Signed)
Delivery Note Called to room for pt feeling sudden pressure. She was in the tub.   Head was delivering.  I helped the anterior shoulder out and lifted the baby to mother's arms.  We gave her some time then helped her out of tub to bed for placenta delivery.  At 3:55 PM a non-viable female was delivered via Vaginal, Spontaneous (Presentation:OA  ).  APGAR: 0, 0; weight  .   Placenta status: spontaneous and grossly intact .  Cord:  with the following complications: Double wrapped around feet  Ankles were crossed and and the cord was wrapped once around each ankle.    Anesthesia:  none Episiotomy: None Lacerations: None Suture Repair: none Est. Blood Loss (mL): 100  Mom to Recover in room then wants to go home.  [facility]  Danielle BourgeoisMarie Anthany Rubio 04/10/2017, 4:48 PM

## 2016-10-03 ENCOUNTER — Ambulatory Visit (INDEPENDENT_AMBULATORY_CARE_PROVIDER_SITE_OTHER): Payer: BC Managed Care – PPO | Admitting: Obstetrics & Gynecology

## 2016-10-03 ENCOUNTER — Encounter: Payer: Self-pay | Admitting: Obstetrics & Gynecology

## 2016-10-03 DIAGNOSIS — Z3480 Encounter for supervision of other normal pregnancy, unspecified trimester: Secondary | ICD-10-CM

## 2016-10-03 DIAGNOSIS — Z3687 Encounter for antenatal screening for uncertain dates: Secondary | ICD-10-CM

## 2016-10-03 DIAGNOSIS — Z3481 Encounter for supervision of other normal pregnancy, first trimester: Secondary | ICD-10-CM

## 2016-10-03 NOTE — Progress Notes (Signed)
Last pap 6/16.  Nausea.BABYSCRIPTS PATIENT: [x ] initial, [ ]  12, [ ]  20, [ ]  28, [ ]  32, [ ]  36, [ ]  38, [ ]  2939 is a 31 y.o. year old 293P2002 with LMP Patient's last menstrual period was 07/22/2016. which would correlate to  7073w3d weeks gestation.  She has menstrual cycles.   She is here today for a confirmatory initial sonogram.         FETAL ACTIVITY:          Heart rate         185          The fetus is 27.94mm  PLACENTA LOCALIZATION:   GRADE 0  CERVIX: Measurcm  ADNEXA: The ovaries are Normal   GESTATIONAL AGE AND  BIOMETRICS:  Gestational criteria: Estimated Date of Delivery: 05/05/17 by early ultrasound now at 4673w3d  Previous Scans:0  GESTATIONAL SAC    20  mm         5673w3d weeks  CROWN RUMP LENGTH         27.834mm         8973w3d weeks                                                                               AVERAGE EGA(BY THIS SCAN):1273w3d weeks  WORKING EDD 05/05/17     TECHNICIAN COMMENTS:Normal     Danielle Rubio, Danielle Rubio 10/03/2016 2:47 PM

## 2016-10-03 NOTE — Assessment & Plan Note (Signed)
BABYSCRIPTS PATIENT: [x ] initial, [ ] 12, [ ] 20, [ ] 28, [ ] 32, [ ] 36, [ ] 38, [ ] 39, [ ] 40 

## 2016-10-03 NOTE — Progress Notes (Signed)
  Subjective:    Danielle Rubio is a 31 yo MW G3P2002 846w3d being seen today for her first obstetrical visit.  Her obstetrical history is significant for none. Patient does intend to breast feed. Pregnancy history fully reviewed. She plans to have another water birth.  Patient reports no complaints.  Vitals:   10/03/16 1359  BP: 126/79  Pulse: 96  Weight: 166 lb (75.3 kg)    HISTORY: OB History  Gravida Para Term Preterm AB Living  3 2 2     2   SAB TAB Ectopic Multiple Live Births        0 2    # Outcome Date GA Lbr Len/2nd Weight Sex Delivery Anes PTL Lv  3 Current           2 Term 06/02/15 6733w2d 34:04 / 00:06 9 lb 8 oz (4.309 kg) F Vag-Spont None  LIV  1 Term 10/03/13 411w5d 02:26 / 00:38 8 lb 0.4 oz (3.64 kg) F Vag-Spont None  LIV     Birth Comments: See note in infant chart. Course respirations and slow to gain color.     Past Medical History:  Diagnosis Date  . Medical history non-contributory    Past Surgical History:  Procedure Laterality Date  . NO PAST SURGERIES    . WISDOM TOOTH EXTRACTION     Family History  Problem Relation Age of Onset  . Cancer Maternal Aunt        bladder cancer  . Alzheimer's disease Maternal Grandmother   . Cancer Maternal Grandfather        pancreatic     Exam    Uterus:     Pelvic Exam:    Perineum: No Hemorrhoids   Vulva: normal   Vagina:  normal mucosa   pH:    Cervix: anteverted   Adnexa: normal adnexa   Bony Pelvis: android  System: Breast:  normal appearance, no masses or tenderness   Skin: normal coloration and turgor, no rashes    Neurologic: oriented   Extremities: normal strength, tone, and muscle mass   HEENT PERRLA   Mouth/Teeth mucous membranes moist, pharynx normal without lesions   Neck supple   Cardiovascular: regular rate and rhythm   Respiratory:  appears well, vitals normal, no respiratory distress, acyanotic, normal RR, ear and throat exam is normal, neck free of mass or lymphadenopathy, chest  clear, no wheezing, crepitations, rhonchi, normal symmetric air entry   Abdomen: soft, non-tender; bowel sounds normal; no masses,  no organomegaly   Urinary: urethral meatus normal      Assessment:    Pregnancy: [id number] Patient Active Problem List   Diagnosis Date Noted  . Supervision of other normal pregnancy, antepartum 10/03/2016        Plan:     Initial labs drawn. Prenatal vitamins. Problem list reviewed and updated. Genetic Screening discussed and decliined.   Ultrasound discussed; fetal survey: requested.  Follow up in 3 weeks.  Danielle Rubio 10/03/2016

## 2016-10-04 LAB — PRENATAL PROFILE (SOLSTAS)
ANTIBODY SCREEN: NEGATIVE
Basophils Absolute: 0 cells/uL (ref 0–200)
Basophils Relative: 0 %
EOS PCT: 1 %
Eosinophils Absolute: 79 cells/uL (ref 15–500)
HEMATOCRIT: 37.9 % (ref 35.0–45.0)
HEMOGLOBIN: 12.5 g/dL (ref 11.7–15.5)
HEP B S AG: NEGATIVE
HIV 1&2 Ab, 4th Generation: NONREACTIVE
LYMPHS ABS: 2291 {cells}/uL (ref 850–3900)
Lymphocytes Relative: 29 %
MCH: 30.8 pg (ref 27.0–33.0)
MCHC: 33 g/dL (ref 32.0–36.0)
MCV: 93.3 fL (ref 80.0–100.0)
MONOS PCT: 6 %
MPV: 11.2 fL (ref 7.5–12.5)
Monocytes Absolute: 474 cells/uL (ref 200–950)
NEUTROS ABS: 5056 {cells}/uL (ref 1500–7800)
NEUTROS PCT: 64 %
Platelets: 206 10*3/uL (ref 140–400)
RBC: 4.06 MIL/uL (ref 3.80–5.10)
RDW: 13.8 % (ref 11.0–15.0)
Rh Type: POSITIVE
Rubella: 4.63 Index — ABNORMAL HIGH (ref <0.90)
WBC: 7.9 10*3/uL (ref 3.8–10.8)

## 2016-10-04 LAB — CULTURE, OB URINE: ORGANISM ID, BACTERIA: NO GROWTH

## 2016-10-08 LAB — CYTOLOGY - PAP
CHLAMYDIA, DNA PROBE: NEGATIVE
Diagnosis: NEGATIVE
HPV: NOT DETECTED
NEISSERIA GONORRHEA: NEGATIVE

## 2016-10-15 ENCOUNTER — Encounter: Payer: Self-pay | Admitting: Obstetrics & Gynecology

## 2016-10-23 ENCOUNTER — Encounter: Payer: BC Managed Care – PPO | Admitting: Obstetrics & Gynecology

## 2016-10-25 ENCOUNTER — Ambulatory Visit (INDEPENDENT_AMBULATORY_CARE_PROVIDER_SITE_OTHER): Payer: BC Managed Care – PPO | Admitting: Advanced Practice Midwife

## 2016-10-25 VITALS — BP 108/72 | HR 94 | Wt 166.0 lb

## 2016-10-25 DIAGNOSIS — Z3A19 19 weeks gestation of pregnancy: Secondary | ICD-10-CM

## 2016-10-25 DIAGNOSIS — Z3481 Encounter for supervision of other normal pregnancy, first trimester: Secondary | ICD-10-CM

## 2016-10-25 DIAGNOSIS — Z363 Encounter for antenatal screening for malformations: Secondary | ICD-10-CM

## 2016-10-25 DIAGNOSIS — Z3480 Encounter for supervision of other normal pregnancy, unspecified trimester: Secondary | ICD-10-CM

## 2016-10-25 NOTE — Progress Notes (Signed)
   PRENATAL VISIT NOTE  Subjective:  Judie Grievellyson Fowle is a 31 y.o. G3P2002 at 3433w4d being seen today for ongoing prenatal care.  She is currently monitored for the following issues for this low-risk pregnancy and has Supervision of other normal pregnancy, antepartum on her problem list.  Patient reports RLP.   . Vag. Bleeding: None.   . Denies leaking of fluid.   The following portions of the patient's history were reviewed and updated as appropriate: allergies, current medications, past family history, past medical history, past social history, past surgical history and problem list. Problem list updated.  Objective:   Vitals:   10/25/16 0933  BP: 108/72  Pulse: 94  Weight: 166 lb (75.3 kg)    Fetal Status: Fetal Heart Rate (bpm): 155         General:  Alert, oriented and cooperative. Patient is in no acute distress.  Skin: Skin is warm and dry. No rash noted.   Cardiovascular: Normal heart rate noted  Respiratory: Normal respiratory effort, no problems with respiration noted  Abdomen: Soft, gravid, appropriate for gestational age.       Pelvic:  Cervical exam deferred        Extremities: Normal range of motion.     Mental Status: Normal mood and affect. Normal behavior. Normal judgment and thought content.   Assessment and Plan:  Pregnancy: G3P2002 at 5733w4d  1. Supervision of other normal pregnancy, antepartum  - US MFM OB COMP + 14 WK; Future  2. Screening, antenatal, for malformation by ultrasound  - US MFM OB COMP + 14 WK; Future  3. [redacted] weeks gestation of pregnancy  - US MFM OB COMP + 14 WK; Future  Preterm labor symptoms and general obstetric precautions including but not limited to vaginal bleeding, contractions, leaking of fluid and fetal movement were reviewed in detail with the patient. Please refer to After Visit Summary for other counseling recommendations.  Return in about 7 weeks (around 12/13/2016) for ROB.   Dorathy KinsmanVirginia Jerline Linzy, CNM

## 2016-12-09 ENCOUNTER — Ambulatory Visit (HOSPITAL_COMMUNITY)
Admission: RE | Admit: 2016-12-09 | Discharge: 2016-12-09 | Disposition: A | Discharge status: Home / Self Care | Payer: BC Managed Care – PPO | Source: Ambulatory Visit | Attending: Advanced Practice Midwife | Admitting: Advanced Practice Midwife

## 2016-12-09 DIAGNOSIS — Z3482 Encounter for supervision of other normal pregnancy, second trimester: Secondary | ICD-10-CM | POA: Diagnosis present

## 2016-12-09 DIAGNOSIS — Z363 Encounter for antenatal screening for malformations: Secondary | ICD-10-CM | POA: Diagnosis not present

## 2016-12-09 DIAGNOSIS — Z3A19 19 weeks gestation of pregnancy: Secondary | ICD-10-CM | POA: Insufficient documentation

## 2016-12-09 DIAGNOSIS — Z3480 Encounter for supervision of other normal pregnancy, unspecified trimester: Secondary | ICD-10-CM

## 2016-12-16 ENCOUNTER — Ambulatory Visit (INDEPENDENT_AMBULATORY_CARE_PROVIDER_SITE_OTHER): Payer: BC Managed Care – PPO | Admitting: Obstetrics & Gynecology

## 2016-12-16 ENCOUNTER — Encounter: Payer: BC Managed Care – PPO | Admitting: Advanced Practice Midwife

## 2016-12-16 VITALS — BP 117/80 | HR 108 | Wt 175.0 lb

## 2016-12-16 DIAGNOSIS — Z3480 Encounter for supervision of other normal pregnancy, unspecified trimester: Secondary | ICD-10-CM

## 2016-12-16 NOTE — Progress Notes (Signed)
   PRENATAL VISIT NOTE  Subjective:  Linah Segars is a 31 y.o. MW  G3P2002 at [redacted]w[redacted]d being seen today for ongoing prenatal care.  She is currently monitored for the following issues for this low-risk pregnancy and has Supervision of other normal pregnancy, antepartum on her problem list.  Patient reports no complaints.   . Vag. Bleeding: None.  Movement: Present. Denies leaking of fluid.   The following portions of the patient's history were reviewed and updated as appropriate: allergies, current medications, past family history, past medical history, past social history, past surgical history and problem list. Problem list updated.  Objective:   Vitals:   12/16/16 1555  BP: 117/80  Pulse: (!) 108  Weight: 175 lb (79.4 kg)    Fetal Status:     Movement: Present     General:  Alert, oriented and cooperative. Patient is in no acute distress.  Skin: Skin is warm and dry. No rash noted.   Cardiovascular: Normal heart rate noted  Respiratory: Normal respiratory effort, no problems with respiration noted  Abdomen: Soft, gravid, appropriate for gestational age.  Pain/Pressure: Absent     Pelvic: Cervical exam deferred        Extremities: Normal range of motion.     Mental Status:  Normal mood and affect. Normal behavior. Normal judgment and thought content.   Assessment and Plan:  Pregnancy: G3P2002 at [redacted]w[redacted]d  1. Supervision of other normal pregnancy, antepartum - Glucola, labs, tdap, flu vaccine at next visit  Preterm labor symptoms and general obstetric precautions including but not limited to vaginal bleeding, contractions, leaking of fluid and fetal movement were reviewed in detail with the patient. Please refer to After Visit Summary for other counseling recommendations.  Return in about 8 weeks (around 02/10/2017) for glucola.   Allie Bossier, MD

## 2017-02-10 ENCOUNTER — Ambulatory Visit (INDEPENDENT_AMBULATORY_CARE_PROVIDER_SITE_OTHER): Payer: BC Managed Care – PPO | Admitting: Advanced Practice Midwife

## 2017-02-10 ENCOUNTER — Encounter: Payer: Self-pay | Admitting: Advanced Practice Midwife

## 2017-02-10 VITALS — BP 104/60 | HR 90 | Wt 184.0 lb

## 2017-02-10 DIAGNOSIS — Z3480 Encounter for supervision of other normal pregnancy, unspecified trimester: Secondary | ICD-10-CM

## 2017-02-10 NOTE — Patient Instructions (Addendum)
Pregnancy and Influenza Influenza, also called the flu, is an infection of the respiratory tract. If you are pregnant, you are more likely to catch the flu. You are also more likely to have a more serious case of the flu. This is because pregnancy lowers your body's ability to fight off infections (it weakens your immune system). It also puts additional stress on your heart and lungs, which makes you more likely to have complications. Having a bad case of the flu, especially with a high fever, can be dangerous for your developing baby. It can cause you to go into early labor. How do people get the flu? The flu is caused by the influenza virus. This virus is common every year in the fall and winter. It spreads when virus particles get passed from person to person. You can get the virus if you are near a sick person who is coughing or sneezing. You can also get the virus if you touch something that has the virus on it and then touch your face. How can I protect myself against the flu?  Get a flu shot. The best way to prevent the flu is to get a flu shot before flu season starts. The flu shot is not dangerous for your developing baby. It may even help protect your baby from the flu for up to 6 months after birth. The flu shot is one type of flu vaccine. Another type is a nasal spray vaccine. Do not get the nasal spray vaccine. It is not approved for pregnancy.  Do not come in close contact with sick people.  Do not share food, drinks, or utensils with other people.  Wash your hands often. Use hand sanitizer when soap and water are not available. What should I do if I have flu symptoms? If you have any flu symptoms, call your health care provider right away. Flu symptoms include:  Fever or chills.  Muscle aches.  Headache.  Sore throat.  Nasal congestion.  Cough.  Feeling tired.  Loss of appetite.  Vomiting.  Diarrhea.  You may be able to take an antiviral medicine to keep the flu  from becoming severe and to shorten how long it lasts. What should I do at home if I am diagnosed with the flu?  Do not take any medicine, including cold or flu medicine, unless directed by your health care provider.  If you take antiviral medicine, make sure you finish it even if you start to feel better.  Drink enough fluid to keep your urine clear or pale yellow.  Get plenty of rest. When would I seek immediate medical care if I have the flu?  You have trouble breathing.  You have chest pain.  You begin to have labor pains.  You have a high fever that does not go down after you take medicine.  You do not feel your baby move.  You have diarrhea or vomiting that will not go away. This information is not intended to replace advice given to you by your health care provider. Make sure you discuss any questions you have with your health care provider. Document Released: 02/16/2008 Document Revised: 09/21/2015 Document Reviewed: 03/12/2013 Elsevier Interactive Patient Education  2017 Elsevier Inc.   Whooping Cough is Deadly for [employer]  Whooping cough (Pertussis) is a very contagious disease that can be deadly for babies. It spreads from person to person, usually by coughing or sneezing while in close contact with others. Your baby could be at  risk for getting whooping cough People used to think of whooping cough as a disease of the past, but it's making a comeback. Learn why reported cases of whooping cough are increasing. Since 2010, CDC sees between 10,000 and 50,000 cases of whooping cough each year in the [place]. In fact, there are cases reported in every state. 2012 was a record year with more than 48,000 cases, the most cases that CDC has seen in the past 60 years. Most of the deaths each year are in babies younger than 46 months of age. Since 2010, up to 20 babies die from it each year in the [place]. Even though it seems like these are a lot of cases, the  [place] no longer has the 200,000 cases per year like it did before whooping cough vaccines were available. Even healthy babies get whooping cough Read a true story about a baby who got whooping cough. In the first 6 months of life, babies are at high risk for complications from whooping cough, even if they are healthy. This is because their immune systems are still developing. In fact, babies younger than 53 months of age only have the antibodies they get from their mother to help protect them. It is important for you to get the whooping cough vaccine during each pregnancy. This allows you to transfer the greatest amount of protective antibodies to each of your babies. Whooping cough can be serious for you, your baby, and your family Whooping cough can cause serious and sometimes life-threatening complications in babies. This is especially true within the first 6 months of life. It is important to know that many babies with whooping cough don't cough at all. Instead it causes them to stop breathing and turn blue. About half of babies who get whooping cough end up in the hospital. The younger the baby is when she gets whooping cough, the more likely it is that doctors will need to treat her in the hospital. Of those babies who get treatment for whooping cough in a hospital, about 1 out of 4 will get pneumonia and 1 or 2 out of 100 will die. Other complications include violent, uncontrolled shaking, life-threatening pauses in breathing, and brain disease. View photos of a baby being treated for whooping cough. Adolescents and adults can also have complications from whooping cough. They are usually less serious in this age group, especially in those who got vaccines against whooping cough. The cough itself often causes common complications in adolescents and adults, including loss of bladder control, fainting, and rib fractures. View videos of a child and an adult with whooping cough. Antibiotics are  used to treat whooping cough Doctors generally treat whooping cough with antibiotics (medications that help treat diseases caused by bacteria). Learn why early treatment is so important and get tips for caring for your baby. Your doctor or local health department may recommend preventive antibiotics to close contacts of a whooping cough patient, including all household members. This might prevent or reduce the chance of getting whooping cough.

## 2017-02-10 NOTE — Progress Notes (Signed)
   PRENATAL VISIT NOTE  Subjective:  Danielle Rubio is a 31 y.o. G3P2002 at [redacted]w[redacted]d being seen today for ongoing prenatal care.  She is currently monitored for the following issues for this low-risk pregnancy and has Supervision of other normal pregnancy, antepartum on her problem list.  Patient reports heartburn.  Contractions: Not present. Vag. Bleeding: None.  Movement: Present. Denies leaking of fluid.   The following portions of the patient's history were reviewed and updated as appropriate: allergies, current medications, past family history, past medical history, past social history, past surgical history and problem list. Problem list updated.  Objective:   Vitals:   02/10/17 0757  BP: 104/60  Pulse: 90  Weight: 184 lb (83.5 kg)    Fetal Status: Fetal Heart Rate (bpm): 135 Fundal Height: 30 cm Movement: Present  Presentation: Vertex  General:  Alert, oriented and cooperative. Patient is in no acute distress.  Skin: Skin is warm and dry. No rash noted.   Cardiovascular: Normal heart rate noted  Respiratory: Normal respiratory effort, no problems with respiration noted  Abdomen: Soft, gravid, appropriate for gestational age.  Pain/Pressure: Absent     Pelvic: Cervical exam deferred        Extremities: Normal range of motion.     Mental Status:  Normal mood and affect. Normal behavior. Normal judgment and thought content.   Assessment and Plan:  Pregnancy: G3P2002 at [redacted]w[redacted]d  1. Supervision of other normal pregnancy, antepartum  - 2Hr GTT w/ 1 Hr Carpenter 75 g - CBC - HIV antibody (with reflex) - RPR  Preterm labor symptoms and general obstetric precautions including but not limited to vaginal bleeding, contractions, leaking of fluid and fetal movement were reviewed in detail with the patient. Please refer to After Visit Summary for other counseling recommendations.  F/U 4 weeks   Dorathy Kinsman, [place]

## 2017-02-11 LAB — CBC
HCT: 32.2 % — ABNORMAL LOW (ref 35.0–45.0)
Hemoglobin: 11 g/dL — ABNORMAL LOW (ref 11.7–15.5)
MCH: 32 pg (ref 27.0–33.0)
MCHC: 34.2 g/dL (ref 32.0–36.0)
MCV: 93.6 fL (ref 80.0–100.0)
MPV: 11.8 fL (ref 7.5–12.5)
PLATELETS: 153 10*3/uL (ref 140–400)
RBC: 3.44 10*6/uL — AB (ref 3.80–5.10)
RDW: 12 % (ref 11.0–15.0)
WBC: 6.7 10*3/uL (ref 3.8–10.8)

## 2017-02-11 LAB — 2HR GTT W 1 HR, CARPENTER, 75 G
GLUCOSE, 1 HR, GEST: 96 mg/dL (ref 65–179)
GLUCOSE, 2 HR, GEST: 81 mg/dL (ref 65–152)
Glucose, Fasting, Gest: 71 mg/dL (ref 65–91)

## 2017-02-11 LAB — HIV ANTIBODY (ROUTINE TESTING W REFLEX): HIV 1&2 Ab, 4th Generation: NONREACTIVE

## 2017-02-11 LAB — RPR: RPR Ser Ql: NONREACTIVE

## 2017-03-10 ENCOUNTER — Ambulatory Visit (INDEPENDENT_AMBULATORY_CARE_PROVIDER_SITE_OTHER): Payer: BC Managed Care – PPO | Admitting: Obstetrics & Gynecology

## 2017-03-10 DIAGNOSIS — Z3480 Encounter for supervision of other normal pregnancy, unspecified trimester: Secondary | ICD-10-CM

## 2017-03-10 DIAGNOSIS — Z3483 Encounter for supervision of other normal pregnancy, third trimester: Secondary | ICD-10-CM

## 2017-03-10 NOTE — Progress Notes (Signed)
   PRENATAL VISIT NOTE  Subjective:  Danielle Rubio is a 31 y.o. G3P2002 at 10356w0d being seen today for ongoing prenatal care.  She is currently monitored for the following issues for this low-risk pregnancy and has Supervision of other normal pregnancy, antepartum on their problem list.  Patient reports no complaints.  Contractions: Not present. Vag. Bleeding: None.  Movement: Present. Denies leaking of fluid.   The following portions of the patient's history were reviewed and updated as appropriate: allergies, current medications, past family history, past medical history, past social history, past surgical history and problem list. Problem list updated.  Objective:   Vitals:   03/10/17 1511  BP: 113/65  Pulse: (!) 107  Weight: 189 lb (85.7 kg)    Fetal Status: Fetal Heart Rate (bpm): 147 Fundal Height: 32 cm Movement: Present     General:  Alert, oriented and cooperative. Patient is in no acute distress.  Skin: Skin is warm and dry. No rash noted.   Cardiovascular: Normal heart rate noted  Respiratory: Normal respiratory effort, no problems with respiration noted  Abdomen: Soft, gravid, appropriate for gestational age.  Pain/Pressure: Absent     Pelvic: Cervical exam deferred        Extremities: Normal range of motion.  Edema: None  Mental Status:  Normal mood and affect. Normal behavior. Normal judgment and thought content.   Assessment and Plan:  Pregnancy: G3P2002 at 3556w0d  1. Supervision of other normal pregnancy, antepartum Pt did not take BP from 02/09/17 until 02/1017.  Pt states she was busy.  She did get the texts reminding her to take it.  Pt agrees to take weekly BP and weights.  RN will call next Tuesday if she hasn't taken it.  Preterm labor symptoms and general obstetric precautions including but not limited to vaginal bleeding, contractions, leaking of fluid and fetal movement were reviewed in detail with the patient. Please refer to After Visit Summary for  other counseling recommendations.   RTC 4 weeks  Elsie LincolnKelly Leggett, MD

## 2017-04-07 ENCOUNTER — Encounter: Payer: BC Managed Care – PPO | Admitting: Obstetrics & Gynecology

## 2017-04-09 ENCOUNTER — Inpatient Hospital Stay (HOSPITAL_COMMUNITY): Payer: BC Managed Care – PPO

## 2017-04-09 ENCOUNTER — Inpatient Hospital Stay (HOSPITAL_COMMUNITY)
Admission: AD | Admit: 2017-04-09 | Discharge: 2017-04-10 | DRG: 807 | Disposition: A | Discharge status: Home / Self Care | Payer: BC Managed Care – PPO | Source: Ambulatory Visit | Attending: Obstetrics and Gynecology | Admitting: Obstetrics and Gynecology

## 2017-04-09 ENCOUNTER — Encounter (HOSPITAL_COMMUNITY): Payer: Self-pay | Admitting: Advanced Practice Midwife

## 2017-04-09 ENCOUNTER — Ambulatory Visit (INDEPENDENT_AMBULATORY_CARE_PROVIDER_SITE_OTHER): Payer: BC Managed Care – PPO | Admitting: Obstetrics & Gynecology

## 2017-04-09 VITALS — Wt 185.0 lb

## 2017-04-09 DIAGNOSIS — O364XX0 Maternal care for intrauterine death, not applicable or unspecified: Secondary | ICD-10-CM

## 2017-04-09 DIAGNOSIS — Z3A36 36 weeks gestation of pregnancy: Secondary | ICD-10-CM

## 2017-04-09 LAB — CBC
HCT: 36.7 % (ref 36.0–46.0)
Hemoglobin: 12.4 g/dL (ref 12.0–15.0)
MCH: 31.9 pg (ref 26.0–34.0)
MCHC: 33.8 g/dL (ref 30.0–36.0)
MCV: 94.3 fL (ref 78.0–100.0)
PLATELETS: 146 10*3/uL — AB (ref 150–400)
RBC: 3.89 MIL/uL (ref 3.87–5.11)
RDW: 12.8 % (ref 11.5–15.5)
WBC: 13.8 10*3/uL — ABNORMAL HIGH (ref 4.0–10.5)

## 2017-04-09 LAB — TYPE AND SCREEN
ABO/RH(D): A POS
ANTIBODY SCREEN: NEGATIVE

## 2017-04-09 MED ORDER — MISOPROSTOL 50MCG HALF TABLET
100.0000 ug | ORAL_TABLET | Freq: Once | ORAL | Status: AC
  Administered 2017-04-09: 100 ug via ORAL
  Filled 2017-04-09: qty 2

## 2017-04-09 MED ORDER — ONDANSETRON HCL 4 MG/2ML IJ SOLN: 4.0000 mg | Freq: Four times a day (QID) | INTRAMUSCULAR | Status: DC | PRN

## 2017-04-09 MED ORDER — OXYTOCIN 40 UNITS IN LACTATED RINGERS INFUSION - SIMPLE MED
2.5000 [IU]/h | INTRAVENOUS | Status: DC
  Filled 2017-04-09: qty 1000

## 2017-04-09 MED ORDER — OXYTOCIN BOLUS FROM INFUSION: 500.0000 mL | Freq: Once | INTRAVENOUS | Status: DC

## 2017-04-09 MED ORDER — LIDOCAINE HCL (PF) 1 % IJ SOLN: 30.0000 mL | INTRAMUSCULAR | Status: DC | PRN

## 2017-04-09 MED ORDER — NALBUPHINE HCL 10 MG/ML IJ SOLN: 5.0000 mg | INTRAMUSCULAR | Status: DC | PRN

## 2017-04-09 MED ORDER — SOD CITRATE-CITRIC ACID 500-334 MG/5ML PO SOLN: 30.0000 mL | ORAL | Status: DC | PRN

## 2017-04-09 MED ORDER — MISOPROSTOL 200 MCG PO TABS: 200.0000 ug | ORAL_TABLET | ORAL | Status: DC | PRN

## 2017-04-09 MED ORDER — OXYCODONE-ACETAMINOPHEN 5-325 MG PO TABS: 1.0000 | ORAL_TABLET | ORAL | Status: DC | PRN

## 2017-04-09 MED ORDER — LACTATED RINGERS IV SOLN
INTRAVENOUS | Status: DC
  Administered 2017-04-09: 15:00:00 via INTRAVENOUS

## 2017-04-09 MED ORDER — LACTATED RINGERS IV SOLN: 500.0000 mL | INTRAVENOUS | Status: DC | PRN

## 2017-04-09 MED ORDER — HYDROXYZINE HCL 50 MG PO TABS: 50.0000 mg | ORAL_TABLET | Freq: Four times a day (QID) | ORAL | Status: DC | PRN

## 2017-04-09 MED ORDER — OXYCODONE-ACETAMINOPHEN 5-325 MG PO TABS: 2.0000 | ORAL_TABLET | ORAL | Status: DC | PRN

## 2017-04-09 MED ORDER — MISOPROSTOL 50MCG HALF TABLET
50.0000 ug | ORAL_TABLET | Freq: Once | ORAL | Status: AC
  Administered 2017-04-09: 50 ug via ORAL
  Filled 2017-04-09: qty 1

## 2017-04-09 MED ORDER — ACETAMINOPHEN 325 MG PO TABS: 650.0000 mg | ORAL_TABLET | ORAL | Status: DC | PRN

## 2017-04-09 NOTE — Anesthesia Pain Management Evaluation Note (Signed)
  [facility] Pain Management Visit Note  Patient: Danielle Rubio, 31 y.o., female  "Hello I am a member of the anesthesia team at [facility]. We have an anesthesia team available at all times to provide care throughout the hospital, including epidural management and anesthesia for C-section. I don't know your plan for the delivery whether it a natural birth, water birth, IV sedation, nitrous supplementation, doula or epidural, but we want to meet your pain goals."   1.Was your pain managed to your expectations on prior hospitalizations?   Yes   2.What is your expectation for pain management during this hospitalization?     Water tub  3.How can we help you reach that goal? Be available  Record the patient's initial score and the patient's pain goal.   Pain: 3  Pain Goal: 5 [facility] wants you to be able to say your pain was always managed very well.  [facility],Danielle Rubio 04/09/2017

## 2017-04-09 NOTE — Progress Notes (Addendum)
   PRENATAL VISIT NOTE  Subjective:  Judie Grievellyson Leitz is a 31 y.o. [id number] at 5023w2d being seen today for ongoing prenatal care.  She is currently monitored for the following issues for this low-risk pregnancy and has Supervision of other normal pregnancy, antepartum on their problem list.  Patient reports decreased fetal moveent this morning.  Felt movement last night.  Contractions: Irritability. Vag. Bleeding: None.  Movement: (!) Decreased. Denies leaking of fluid.  Pt is compliant on Bab[employer] schedule optimization.  The following portions of the patient's history were reviewed and updated as appropriate: allergies, current medications, past family history, past medical history, past social history, past surgical history and problem list. Problem list updated.  Objective:   Vitals:   04/09/17 1104  Weight: 185 lb (83.9 kg)    Fetal Status:     Movement: (!) Decreased     General:  Alert, oriented and cooperative. Patient is in no acute distress.  Skin: Skin is warm and dry. No rash noted.   Cardiovascular: Normal heart rate noted  Respiratory: Normal respiratory effort, no problems with respiration noted  Abdomen: Soft, gravid, appropriate for gestational age.  Pain/Pressure: Present     Pelvic: Cervical exam deferred        Extremities: Normal range of motion.  Edema: None  Mental Status:  Normal mood and affect. Normal behavior. Normal judgment and thought content.   Assessment and Plan:  Pregnancy: G3P2002 at 6323w2d with IUFD.    US shows no FH.  Pt informed and emotional support given.  [facility]  [facility] proceed with testing for IUFD.  Dr. Ashok PallWouk notified nad ready for her arrival.    Elsie LincolnKelly Kamrin Spath, MD

## 2017-04-09 NOTE — Progress Notes (Signed)
Consent for water birth signed

## 2017-04-09 NOTE — Progress Notes (Signed)
Danielle Rubio is a 31 y.o. [id number] at 80103w2d by ultrasound admitted for induction of labor due to IUFD.  Subjective:  Feeling some contractions, but reports they are not regular at this time. Patient still desires to use the tub for pain management and birth.  Objective: BP 115/70   Pulse (!) 111   Temp 99 F (37.2 C) (Oral)   Resp 20   Ht 5\' 6"  (1.676 m)   Wt 185 lb (83.9 kg)   LMP 07/22/2016   BMI 29.86 kg/m  No intake/output data recorded. No intake/output data recorded.  FHT:  NA UC:   Irregular 5-15 mins SVE:   Dilation: Fingertip Effacement (%): 50 Station: -3 Exam by:: Mickie BailAnita Sowder, RN  Labs: Lab Results  Component Value Date   WBC 13.8 (H) 04/09/2017   HGB 12.4 04/09/2017   HCT 36.7 04/09/2017   MCV 94.3 04/09/2017   PLT 146 (L) 04/09/2017    Assessment / Plan: IOL 2/2 IUFD, Plan 2nd dose of cytotec at this time.   Labor: latent phase Preeclampsia:  NA Fetal Wellbeing: NA Pain Control:  Labor support without medications and planning on using the tub when needed. Set up in the room.  I/D:  n/a Anticipated MOD:  NSVD  Thressa ShellerHeather Hogan 04/09/2017, 7:11 PM

## 2017-04-09 NOTE — Progress Notes (Signed)
I offered bereavement support to Danielle Rubio and Danielle Rubio as they prepare for induction of their baby, Danielle Rubio.  They are very much in shock, having found out at an appointment this morning in the clinic that baby had no heartbeat. We spoke a little about the support that we can offer and tried to get a sense of their needs, however, they are still not sure what their needs are.  They have 2 older children, ages 593 and 1.    We will continue to check in on them, but please also page as needs arise.  Chaplain Dyanne CarrelKaty Arah Aro, Bcc Pager, [phone] 3:09 PM

## 2017-04-09 NOTE — H&P (Signed)
Judie Grievellyson Verdone is a 31 y.o. female presenting for IOL 2/2 IUFD. Patient presented to the office today for routine prenatal visit, and reported decreased fetal movement. No FHR by doppler or US. Patient sent to hospital for IOL. Uncomplicated prenatal history.  OB History    Gravida Para Term Preterm AB Living   3 2 2     2    SAB TAB Ectopic Multiple Live Births         0 2     Past Medical History:  Diagnosis Date  . Medical history non-contributory    Past Surgical History:  Procedure Laterality Date  . NO PAST SURGERIES    . WISDOM TOOTH EXTRACTION     Family History: family history includes Alzheimer's disease in her maternal grandmother; Cancer in her maternal aunt and maternal grandfather. Social History:  reports that  has never smoked. she has never used smokeless tobacco. She reports that she does not drink alcohol or use drugs.  Clinic  [facility] Prenatal Labs  Dating  9 Wk US Blood type: A/POS/-- (06/07 1412)   Genetic Screen 1 Screen:    AFP:     Quad:     NIPS: Antibody:NEG (06/07 1412)  Anatomic US  Normal Rubella: 4.63 (06/07 1412)  GTT Early:               Third trimester: normal RPR: NON-REACTIVE (10/15 0810)   Flu vaccine  HBsAg: NEGATIVE (06/07 1412)   TDaP vaccine                                               Rhogam: na HIV: NON-REACTIVE (10/15 0810)  Baby Food   na                                        GBS: (For PCN allergy, check sensitivities)  Contraception  Pap: 10/03/16, NIL  Circumcision  na   Pediatrician  CF:  Support Person  [place]- FOB SMA  Prenatal Classes  Hgb electrophoresis:       Maternal Diabetes: No Genetic Screening: Declined Maternal Ultrasounds/Referrals: Normal Fetal Ultrasounds or other Referrals:  None Maternal Substance Abuse:  No Significant Maternal Medications:  None Significant Maternal Lab Results:  None Other Comments:  None  ROS Maternal Medical History:  Fetal activity: Perceived fetal activity is recently changed.     Prenatal Complications - Diabetes: none.      Last menstrual period 07/22/2016, currently breastfeeding. Maternal Exam:  Abdomen: Patient reports no abdominal tenderness. Pelvis: adequate for delivery.      Physical Exam  Nursing note and vitals reviewed. Constitutional: She is oriented to person, place, and time. She appears well-developed and well-nourished. No distress.  Cardiovascular: Normal rate.  Respiratory: Effort normal.  GI: Soft. There is no tenderness. There is no rebound.  Neurological: She is alert and oriented to person, place, and time.  Skin: Skin is warm and dry.  Psychiatric: She has a normal mood and affect.    Prenatal labs: ABO, Rh: A/POS/-- (06/07 1412) Antibody: NEG (06/07 1412) Rubella: 4.63 (06/07 1412) RPR: NON-REACTIVE (10/15 0810)  HBsAg: NEGATIVE (06/07 1412)  HIV: NON-REACTIVE (10/15 0810)  GBS:   Not done   Assessment/Plan: 31 y.o. [id number] at 3674w2d with IUFD Admit to  labor and delivery US to confirm IOL with cytotec   Thressa ShellerHeather Hogan 04/09/2017, 2:05 PM

## 2017-04-10 ENCOUNTER — Encounter: Payer: BC Managed Care – PPO | Admitting: Obstetrics and Gynecology

## 2017-04-10 ENCOUNTER — Encounter (HOSPITAL_COMMUNITY): Payer: Self-pay | Admitting: *Deleted

## 2017-04-10 DIAGNOSIS — Z3A36 36 weeks gestation of pregnancy: Secondary | ICD-10-CM

## 2017-04-10 DIAGNOSIS — O364XX0 Maternal care for intrauterine death, not applicable or unspecified: Secondary | ICD-10-CM

## 2017-04-10 LAB — TORCH-IGM(TOXO/ RUB/ CMV/ HSV) W TITER
Rubella IgM: <20 AU/mL (ref 0.0–19.9)
Toxoplasma Antibody- IgM: <3 AU/mL (ref 0.0–7.9)

## 2017-04-10 LAB — INFECT DISEASE AB IGM REFLEX 1

## 2017-04-10 LAB — RPR: RPR Ser Ql: NONREACTIVE

## 2017-04-10 MED ORDER — LACTATED RINGERS IV SOLN
INTRAVENOUS | Status: DC
  Administered 2017-04-10 (×2): via INTRAVENOUS

## 2017-04-10 MED ORDER — OXYTOCIN 40 UNITS IN LACTATED RINGERS INFUSION - SIMPLE MED
1.0000 m[IU]/min | INTRAVENOUS | Status: DC
  Administered 2017-04-10: 2 m[IU]/min via INTRAVENOUS
  Administered 2017-04-10: 4 m[IU]/min via INTRAVENOUS
  Administered 2017-04-10: 6 m[IU]/min via INTRAVENOUS
  Administered 2017-04-10: 2 m[IU]/min via INTRAVENOUS
  Administered 2017-04-10: 10 m[IU]/min via INTRAVENOUS
  Administered 2017-04-10: 8 m[IU]/min via INTRAVENOUS

## 2017-04-10 MED ORDER — MISOPROSTOL 200 MCG PO TABS
200.0000 ug | ORAL_TABLET | Freq: Once | ORAL | Status: AC
  Administered 2017-04-10: 200 ug via ORAL
  Filled 2017-04-10: qty 1

## 2017-04-10 MED ORDER — MISOPROSTOL 50MCG HALF TABLET
100.0000 ug | ORAL_TABLET | Freq: Once | ORAL | Status: AC
  Administered 2017-04-10: 100 ug via ORAL
  Filled 2017-04-10: qty 2

## 2017-04-10 NOTE — Progress Notes (Signed)
Patient ID: Danielle Rubio, female   DOB: 02-05-1986, 31 y.o.   MRN: [id number] Doing well, wants to proceed with foley  . Vitals:   04/10/17 0330 04/10/17 0713 04/10/17 0754 04/10/17 1125  BP: (!) 102/55 110/60 111/69 (!) 103/59  Pulse: 97 95 (!) 111 (!) 107  Resp: 17 16 16 16   Temp: 98.6 F (37 C) 98.7 F (37.1 C) 98.4 F (36.9 C)   TempSrc: Oral  Oral   Weight:      Height:       UCs irregular and mild  Dilation: 2 Effacement (%): 80 Cervical Position: Posterior Station: -2 Presentation: Vertex Exam by:: Mayford KnifeWilliams, CNM  Foley inserted  Will start Pitocin

## 2017-04-10 NOTE — Progress Notes (Signed)
Patient ID: Danielle Rubio, female   DOB: 08/10/1985, 31 y.o.   MRN: [id number] Vitals:   04/10/17 1125 04/10/17 1241 04/10/17 1330 04/10/17 1359  BP: (!) 103/59 109/70 (!) 109/52 (!) 103/44  Pulse: (!) 107 (!) 103 (!) 105 (!) 108  Resp: 16 16 16 16   Temp: 98.7 F (37.1 C)     TempSrc: Oral     Weight:      Height:       Foley fell out a while ago  Coping well with UCs, state not very strong yet

## 2017-04-10 NOTE — Progress Notes (Signed)
Patient ID: Danielle Rubio, female   DOB: 04/17/1986, 31 y.o.   MRN: [id number] Doing well, in an out of tub  Vitals:   04/10/17 1330 04/10/17 1359 04/10/17 1427 04/10/17 1502  BP: (!) 109/52 (!) 103/44 95/68 126/79  Pulse: (!) 105 (!) 108 (!) 114 (!) 108  Resp: 16 16 18 18   Temp:      TempSrc:      Weight:      Height:       UCs every 2-4 min  Dilation: 5 Effacement (%): 80 Cervical Position: Posterior Station: -2 Presentation: Vertex Exam by:: Clare CharonKristin Hutchison, RN  AROM dark green fluid  States UCs are already stronger

## 2017-04-10 NOTE — Progress Notes (Signed)
Danielle Rubio is a 31 y.o. [id number] at 4750w2d by ultrasound admitted for induction of labor due to IUFD.  Subjective: Resting comfortably in room. Contraction pain is minimal.   Objective: BP (!) 98/53 (BP Location: Left Arm)   Pulse (!) 105   Temp 98 F (36.7 C) (Axillary)   Resp 18   Ht 5\' 6"  (1.676 m)   Wt 185 lb (83.9 kg)   LMP 07/22/2016   BMI 29.86 kg/m  No intake/output data recorded. No intake/output data recorded.  FHT:  NA UC:   Irregular SVE:   Dilation: 1.5 Effacement (%): 50 Station: -3 Exam by:: Dr. Doroteo GlassmanPhelps  Labs: Lab Results  Component Value Date   WBC 13.8 (H) 04/09/2017   HGB 12.4 04/09/2017   HCT 36.7 04/09/2017   MCV 94.3 04/09/2017   PLT 146 (L) 04/09/2017    Assessment / Plan: IOL 2/2 IUFD Not in labor  Labor: Not in labor, continue induction with cytotec, minimal cervical change after 8hrs Pain Control:  Labor support without medications and planning on using the tub when needed. Set up in the room.  I/D:  n/a Anticipated MOD:  NSVD  Caryl AdaJazma October Peery, DO 04/10/2017, 12:19 AM

## 2017-04-10 NOTE — Progress Notes (Signed)
Patient ID: Danielle Rubio, female   DOB: [birth year], 31 y.o.   MRN: [id number] Doing well, though affect flat.  Wants to get into tub soon Coping with contractions FOB and mother at bedside  Vitals:   04/09/17 2320 04/10/17 0330 04/10/17 0713 04/10/17 0754  BP: (!) 98/53 (!) 102/55 110/60 111/69  Pulse: (!) 105 97 95 (!) 111  Resp:  17 16 16   Temp:  98.6 F (37 C) 98.7 F (37.1 C) 98.4 F (36.9 C)  TempSrc: Axillary Oral  Oral  Weight:      Height:       Dilation: 1.5 Effacement (%): 50 Cervical Position: Posterior Station: -3 Presentation: Vertex Exam by:: Dr. Doroteo GlassmanPhelps Cervix not rechecked  Will recheck cervix when pt ready.

## 2017-04-10 NOTE — Progress Notes (Addendum)
Follow up visit with Danielle Rubio and Danielle Rubio to continue to offer spiritual support during labor and delivery of their baby.  Danielle Rubio was in the birthing tub when I entered to room, so I remained in the doorway to protect her privacy.  I quickly introduced myself as the family had met Chaplain Claussen yesterday.  At this time, the family does not have any needs as Danielle Rubio's primary focus is on working through her labor, our spiritual care team will continue to follow.  Please page as further needs arise.  Donald Prose. Elyn Peers, M.Div. [facility] Chaplain Pager [phone] Office [phone]

## 2017-04-10 NOTE — Discharge Instructions (Signed)
Vaginal Delivery, Care After °Refer to this sheet in the next few weeks. These instructions provide you with information on caring for yourself after your delivery. Your health care provider may also give you more specific instructions. Your treatment has been planned according to current medical practices, but problems sometimes occur. Call your health care provider if you have any problems or questions after your delivery. °What to expect after your delivery °After your delivery, it is typical to have the following: °· You may feel pain in the vaginal area for several days after delivery. If you had an incision or a vaginal tear, the area will probably continue to be tender to the touch for several weeks. °· You may feel very fatigued after a vaginal delivery. °· You may have vaginal bleeding and discharge that will start out red, then become pink, then yellow, then white. Altogether, this usually lasts for about 6 weeks. °· The combination of having lost your baby and changing hormones from the delivery can make you feel very sad. You may also experience emotions that change very quickly. Some of the emotions people often notice after loss include: °? Anger. °? Denial. °? Guilt. °? Sorrow. °? Depression. °? Grief. °? Relationship problems. ° °Follow these instructions at home: °· Consider seeking support for your loss. Some forms of support that you might consider include your religious leader, friends, family, a professional counselor, or a bereavement support group. °· Take medicines only as directed by your health care provider. °· Continue to use good perineal care. Good perineal care includes: °? Wiping your perineum from front to back. °? Keeping your perineum clean. °· Do not use tampons or douche until your health care provider says it is okay. °· Shower, wash your hair, and take tub baths as directed by your health care provider. °· Wear a well-fitting bra that provides breast support. °· Drink enough  fluids to keep your urine clear or pale yellow. °· Eat healthy foods. °· Eat high-fiber foods every day, such as whole grain cereals and breads, brown rice, beans, and fresh fruits and vegetables. These foods may help prevent or relieve constipation. °· Follow your health care provider's directions about resuming activities such as climbing stairs, driving, lifting, exercising, or traveling. °· Increase your activities gradually. °· Talk to your health care provider about resuming sexual activities. This depends on your risk of infection, your rate of healing, and your comfort and desire to resume sexual activity. °· Try to have someone help you with your household activities for at least a few days after you leave the hospital. °· Rest as much as possible. °· Keep all of your scheduled postpartum appointments. It is very important to keep your scheduled follow-up appointments. At these appointments, your health care provider will be checking to make sure that you are healing physically and emotionally. °· Do not drink alcohol, especially if you are taking medicine to relieve pain. °· Do not use any tobacco products including cigarettes, chewing tobacco, or electronic cigarettes. If you need help quitting, ask your health care provider. °· Do not use illegal drugs. °Contact a health care provider if: °· You feel sad or depressed. °· You have thoughts of hurting yourself. °· You are having trouble eating or sleeping. °· You cannot enjoy the things in life you have previously enjoyed. °· You are passing large clots from your vagina. Save any clots to show your health care provider. °· You have a bad smelling discharge from your vagina. °·   You have trouble urinating. °· You are urinating frequently. °· You have pain when you urinate. °· You have a change in your bowel movements. °· You have increasing redness, pain, or swelling near your incision or vaginal tear. °· You have pus draining from your incision or vaginal  tear. °· Your incision or vaginal tear is separating. °· You have painful, hard, or reddened breasts. °· You have a severe headache. °· You have blurred vision or see spots. °· You are dizzy or light-headed. °· You have a rash. °· You have nausea or vomiting. °· You have not had a menstrual period by the 12th week after delivery. °· You have a fever. °Get help right away if: °· You are concerned that you may hurt yourself or you are considering suicide. °· You have persistent pain. °· You have chest pain. °· You have shortness of breath. °· You faint. °· You have leg pain. °· You have stomach pain. °· Your vaginal bleeding saturates two or more sanitary pads in 1 hour. °This information is not intended to replace advice given to you by your health care provider. Make sure you discuss any questions you have with your health care provider. °Document Released: 08/30/2013 Document Revised: 09/21/2015 Document Reviewed: 06/03/2013 °Elsevier Interactive Patient Education © 2018 [employer] Inc[employer] ° °

## 2017-04-10 NOTE — Discharge Summary (Signed)
OB Discharge Summary     Patient Name: Danielle Rubio DOB: 1986-04-09 MRN: [id number]  Date of admission: 04/09/2017 Delivering MD: Aviva SignsWILLIAMS, MARIE L   Date of discharge: 04/10/2017  Admitting diagnosis: IUFD Intrauterine pregnancy: 3774w3d     Secondary diagnosis:  Active Problems:   IUFD at 20 weeks or more of gestation  Additional problems: none     Discharge diagnosis: preterm IUFD, delivered                                                                                                Post partum procedures:none  Augmentation: Cytotec  [facility]  Hospital course:  Induction of Labor With Vaginal Delivery   31 y.o. yo [id number] at 2774w3d was admitted to the hospital 04/09/2017 for induction of labor.  Indication for induction: IUFD.  Patient had an uncomplicated labor course as follows: Membrane Rupture Time/Date: 3:25 PM ,04/10/2017   Intrapartum Procedures: Episiotomy: None [1]                                         Lacerations:  None [1]  Patient had delivery of a Non Viable infant.  Information for the patient's newborn:  Bertell MariaBoyette, PendingBaby FD [[id number]][[id number]]      04/10/2017  Details of delivery can be found in separate delivery note.  Patient had a routine postpartum course. Patient is discharged home 04/10/17.  Physical exam  Vitals:   04/10/17 1734 04/10/17 1810 04/10/17 1936 04/10/17 2314  BP: 120/76 119/73 109/63 106/62  Pulse: 93 78 92 83  Resp: 16 16 16 16   Temp:   99.5 F (37.5 C) 98.6 F (37 C)  TempSrc:   Oral Oral  Weight:      Height:       General: alert, cooperative and no distress Lochia: appropriate Uterine Fundus: firm Incision: N/A DVT Evaluation: No evidence of DVT seen on physical exam. Negative Homan's sign. No cords or calf tenderness. Labs: Lab Results  Component Value Date   WBC 13.8 (H) 04/09/2017   HGB 12.4 04/09/2017   HCT 36.7 04/09/2017   MCV 94.3 04/09/2017   PLT 146 (L) 04/09/2017   No flowsheet  data found.  Discharge instruction: per After Visit Summary and "Baby and Me Booklet".  After visit meds:  Allergies as of 04/10/2017      Reactions   Lac Bovis Itching   Acne   Gluten Meal Other (See Comments)   GI effects   Lactose Intolerance (gi) Other (See Comments)   GI Upset      Medication List    STOP taking these medications   AIRBORNE PO   prenatal multivitamin Tabs tablet       Diet: routine diet  Activity: Advance as tolerated. Pelvic rest for 6 weeks.   Outpatient follow up:6 weeks Follow up Appt:No future appointments. Follow up Visit:No Follow-up on file.  Postpartum contraception: Progesterone only pills  Newborn Data: Live born female  Birth Weight: 6 lb 11.1 oz (3035 g)  APGAR: 0, 0  Newborn Delivery   Birth date/time:  04/10/2017 15:55:00 Delivery type:  Vaginal, Spontaneous     Baby Feeding: n/a Disposition:morgue   04/10/2017 CRESENZO-DISHMAN,Caisen Mangas, CNM

## 2017-05-22 ENCOUNTER — Encounter: Payer: Self-pay | Admitting: Obstetrics & Gynecology

## 2017-05-22 ENCOUNTER — Ambulatory Visit (INDEPENDENT_AMBULATORY_CARE_PROVIDER_SITE_OTHER): Payer: BC Managed Care – PPO | Admitting: Obstetrics & Gynecology

## 2017-05-22 NOTE — Progress Notes (Signed)
Post Partum Exam  Danielle Rubio is a 32 y.o. 213P2102 female who presents for a postpartum visit. She is 6 weeks postpartum following an IUFD @ 828w2d.She was induced and gave birth to a female which had the cord wrapped around her foot several times. I have fully reviewed the prenatal and intrapartum course. The delivery was at  gestational weeks.  Anesthesia: none. Postpartum course has been OK. Bowel function is normal. Bladder function is normal. Patient is sexually active. Contraception method is none. Postpartum depression screening:neg

## 2017-05-22 NOTE — Progress Notes (Signed)
Subjective:     Danielle Rubio is a 32 y.o. female who presents for a postpartum visit. She is 5 weeks postpartum following a IOL at 36 weeks for a fetal demise. I have fully reviewed the prenatal and intrapartum course. Bleeding no bleeding now, had a heavy period last week.  Bowel function is normal. Bladder function is normal. Patient is sexually active. Contraception method is none. Postpartum depression screening: negative.  The following portions of the patient's history were reviewed and updated as appropriate: allergies, current medications, past family history, past medical history, past social history, past surgical history and problem list.  Review of Systems Pertinent items are noted in HPI.   Objective:   Breathing, conversing, and ambulating normally Well nourished, well hydrated White female, no apparent distress   There were no vitals taken for this visit.                                         Assessment:   postpartum exam. Pap smear not done at today's visit. It was normal 6/18.  Plan:   She and hubby want a pregnancy asap. We have discussed ACOG recs regarding pregnancy after a delivery. I have also rec'd MVI/PNV for 8 weeks prior to conception to decrease risk of ONTDs.

## 2017-11-19 ENCOUNTER — Ambulatory Visit: Payer: BC Managed Care – PPO | Admitting: Obstetrics & Gynecology

## 2017-11-19 ENCOUNTER — Encounter: Payer: Self-pay | Admitting: Obstetrics & Gynecology

## 2017-11-19 VITALS — BP 104/67 | HR 91 | Resp 16 | Ht 65.0 in | Wt 163.0 lb

## 2017-11-19 DIAGNOSIS — N926 Irregular menstruation, unspecified: Secondary | ICD-10-CM

## 2017-11-19 DIAGNOSIS — Z8759 Personal history of other complications of pregnancy, childbirth and the puerperium: Secondary | ICD-10-CM | POA: Insufficient documentation

## 2017-11-19 DIAGNOSIS — Z3201 Encounter for pregnancy test, result positive: Secondary | ICD-10-CM | POA: Diagnosis not present

## 2017-11-19 DIAGNOSIS — N912 Amenorrhea, unspecified: Secondary | ICD-10-CM

## 2017-11-19 LAB — POCT URINE PREGNANCY: Preg Test, Ur: POSITIVE — AB

## 2017-11-19 NOTE — Progress Notes (Signed)
   Subjective:    Patient ID: Danielle Rubio, female    DOB: June 10, 1985, 32 y.o.   MRN: [id number]  HPI  Pt here for 3 months of irregular bleeding.  Pt did take pregnancy test for 2 months and negative.  Last full period was June 26th.  First pregnancy test positive is today and + here as well.    Review of Systems  Constitutional: Negative.   Respiratory: Negative.   Cardiovascular: Negative.   Gastrointestinal: Negative.   Genitourinary: Positive for pelvic pain. Negative for dyspareunia, vaginal bleeding and vaginal discharge.       Objective:   Physical Exam  Constitutional: She is oriented to person, place, and time. She appears well-developed and well-nourished. No distress.  HENT:  Head: Normocephalic and atraumatic.  Eyes: Conjunctivae are normal.  Cardiovascular: Normal rate.  Pulmonary/Chest: Effort normal.  Musculoskeletal: She exhibits no edema.  Neurological: She is alert and oriented to person, place, and time.  Skin: Skin is warm and dry.  Psychiatric: She has a normal mood and affect.  Vitals reviewed.  Vitals:   11/19/17 0946  BP: 104/67  Pulse: 91  Resp: 16  Weight: 163 lb (73.9 kg)  Height: 5\' 5"  (1.651 m)       Assessment & Plan:  32 yo female with menstrual irregularity x3 months.  +pregnancy test Probably IUGS; no pain nor bleeding Beta today and in 48 hours Prenatal vitamins  RTC 8 weeks for new OB. Bleeding / pain precautions given  Hx of IUFD at 36 weeks APLA work up TSH

## 2017-11-20 LAB — HCG, QUANTITATIVE, PREGNANCY: HCG, Total, QN: 111 m[IU]/mL

## 2017-11-21 ENCOUNTER — Other Ambulatory Visit: Payer: BC Managed Care – PPO | Admitting: *Deleted

## 2017-11-21 DIAGNOSIS — Z3200 Encounter for pregnancy test, result unknown: Secondary | ICD-10-CM

## 2017-11-21 LAB — TSH: TSH: 1.33 m[IU]/L

## 2017-11-22 LAB — HCG, QUANTITATIVE, PREGNANCY: HCG, Total, QN: 267 m[IU]/mL

## 2017-11-24 LAB — CARDIOLIPIN ANTIBODIES, IGG, IGM, IGA
Anticardiolipin IgG: <9 GPL U/mL (ref 0–14)
Anticardiolipin IgM: 10 MPL U/mL (ref 0–12)

## 2017-11-24 LAB — BETA-2-GLYCOPROTEIN I ABS, IGG/M/A: Beta-2 Glyco I IgG: <9 GPI IgG units (ref 0–20)

## 2017-11-25 LAB — LUPUS ANTICOAGULANT PANEL
Dilute Viper Venom Time: 32.3 s (ref 0.0–47.0)
PTT Lupus Anticoagulant: 37.9 s (ref 0.0–51.9)

## 2017-12-15 ENCOUNTER — Encounter: Payer: BC Managed Care – PPO | Admitting: Obstetrics & Gynecology

## 2017-12-15 ENCOUNTER — Encounter: Payer: Self-pay | Admitting: Obstetrics & Gynecology

## 2017-12-15 ENCOUNTER — Ambulatory Visit (INDEPENDENT_AMBULATORY_CARE_PROVIDER_SITE_OTHER): Payer: BC Managed Care – PPO | Admitting: Obstetrics & Gynecology

## 2017-12-15 VITALS — BP 108/68 | HR 84 | Wt 168.0 lb

## 2017-12-15 DIAGNOSIS — Z3480 Encounter for supervision of other normal pregnancy, unspecified trimester: Secondary | ICD-10-CM | POA: Insufficient documentation

## 2017-12-15 DIAGNOSIS — Z113 Encounter for screening for infections with a predominantly sexual mode of transmission: Secondary | ICD-10-CM

## 2017-12-15 DIAGNOSIS — Z3687 Encounter for antenatal screening for uncertain dates: Secondary | ICD-10-CM

## 2017-12-15 DIAGNOSIS — Z3482 Encounter for supervision of other normal pregnancy, second trimester: Secondary | ICD-10-CM

## 2017-12-15 DIAGNOSIS — Z8759 Personal history of other complications of pregnancy, childbirth and the puerperium: Secondary | ICD-10-CM

## 2017-12-15 NOTE — Progress Notes (Signed)
Bedside U/S shows single IUP with FHT of 162 BPM and CRL 14.594mm  GA is 3728w5d

## 2017-12-15 NOTE — Progress Notes (Signed)
  Subjective:    Danielle Rubio is a married [phone] being seen today for her first obstetrical visit.  This is a planned pregnancy. She is at 2757w6d gestation. Her obstetrical history is significant for IUFD . Relationship with FOB: spouse, living together. Patient does intend to breast feed. Pregnancy history fully reviewed.  Patient reports no complaints.  Review of Systems:   Review of Systems  Objective:     BP 108/68   Pulse 84   Wt 168 lb (76.2 kg)   LMP 10/21/2017   BMI 27.96 kg/m  Physical Exam  Exam  Heart- rrr Lungs- CTAB Abd- benign Bimanual exam- 8 week size uterus    Assessment:    Pregnancy: [id number] Patient Active Problem List   Diagnosis Date Noted  . Supervision of other normal pregnancy, antepartum 12/15/2017  . History of IUFD 11/19/2017       Plan:     Initial labs drawn. Prenatal vitamins. Problem list reviewed and updated. AFP3 discussed: declined. Role of ultrasound in pregnancy discussed; fetal survey: requested. Amniocentesis discussed: not indicated. Follow up in 4 weeks. She wants optomized [employer] (again) Flu vaccine in second trimester She declines genetic testing   Allie BossierMyra C Riko Lumsden 12/15/2017

## 2017-12-16 LAB — OBSTETRIC PANEL
Antibody Screen: NOT DETECTED
BASOS ABS: 7 {cells}/uL (ref 0–200)
Basophils Relative: 0.1 %
EOS ABS: 7 {cells}/uL — AB (ref 15–500)
Eosinophils Relative: 0.1 %
HCT: 33.9 % — ABNORMAL LOW (ref 35.0–45.0)
Hemoglobin: 11.5 g/dL — ABNORMAL LOW (ref 11.7–15.5)
Hepatitis B Surface Ag: NONREACTIVE
LYMPHS ABS: 1931 {cells}/uL (ref 850–3900)
MCH: 30.6 pg (ref 27.0–33.0)
MCHC: 33.9 g/dL (ref 32.0–36.0)
MCV: 90.2 fL (ref 80.0–100.0)
MPV: 12.2 fL (ref 7.5–12.5)
Monocytes Relative: 7.9 %
NEUTROS PCT: 63.5 %
Neutro Abs: 4318 cells/uL (ref 1500–7800)
Platelets: 172 10*3/uL (ref 140–400)
RBC: 3.76 10*6/uL — ABNORMAL LOW (ref 3.80–5.10)
RDW: 12.3 % (ref 11.0–15.0)
RPR: NONREACTIVE
RUBELLA: 3.59 {index}
Total Lymphocyte: 28.4 %
WBC mixed population: 537 cells/uL (ref 200–950)
WBC: 6.8 10*3/uL (ref 3.8–10.8)

## 2017-12-16 LAB — HIV ANTIBODY (ROUTINE TESTING W REFLEX): HIV: NONREACTIVE

## 2017-12-17 ENCOUNTER — Encounter: Payer: BC Managed Care – PPO | Admitting: Obstetrics & Gynecology

## 2017-12-17 LAB — CULTURE, OB URINE

## 2017-12-17 LAB — URINE CULTURE, OB REFLEX

## 2017-12-17 LAB — GC/CHLAMYDIA PROBE AMP (~~LOC~~) NOT AT ARMC
CHLAMYDIA, DNA PROBE: NEGATIVE
Neisseria Gonorrhea: NEGATIVE

## 2018-01-19 ENCOUNTER — Ambulatory Visit (INDEPENDENT_AMBULATORY_CARE_PROVIDER_SITE_OTHER): Payer: BC Managed Care – PPO | Admitting: Obstetrics & Gynecology

## 2018-01-19 VITALS — BP 120/84 | HR 88 | Wt 170.0 lb

## 2018-01-19 DIAGNOSIS — Z8759 Personal history of other complications of pregnancy, childbirth and the puerperium: Secondary | ICD-10-CM

## 2018-01-19 DIAGNOSIS — Z3480 Encounter for supervision of other normal pregnancy, unspecified trimester: Secondary | ICD-10-CM

## 2018-01-19 NOTE — Progress Notes (Signed)
   PRENATAL VISIT NOTE  Subjective:  Danielle Rubio is a 32 y.o. [phone] at 7683w6d being seen today for ongoing prenatal care.  She is currently monitored for the following issues for this low-risk pregnancy and has History of IUFD and Supervision of other normal pregnancy, antepartum on their problem list.  Patient reports no complaints.   . Vag. Bleeding: None.   . Denies leaking of fluid.   The following portions of the patient's history were reviewed and updated as appropriate: allergies, current medications, past family history, past medical history, past social history, past surgical history and problem list. Problem list updated.  Objective:   Vitals:   01/19/18 1612  BP: 120/84  Pulse: 88  Weight: 170 lb (77.1 kg)    Fetal Status:           General:  Alert, oriented and cooperative. Patient is in no acute distress.  Skin: Skin is warm and dry. No rash noted.   Cardiovascular: Normal heart rate noted  Respiratory: Normal respiratory effort, no problems with respiration noted  Abdomen: Soft, gravid, appropriate for gestational age.        Pelvic: Cervical exam deferred        Extremities: Normal range of motion.  Edema: None  Mental Status: Normal mood and affect. Normal behavior. Normal judgment and thought content.   Assessment and Plan:  Pregnancy: [id number] at 8883w6d  1. Supervision of other normal pregnancy, antepartum  - US MFM OB DETAIL +14 WK; Future - Hemoglobin A1c  2. History of IUFD   Preterm labor symptoms and general obstetric precautions including but not limited to vaginal bleeding, contractions, leaking of fluid and fetal movement were reviewed in detail with the patient. Please refer to After Visit Summary for other counseling recommendations.  No follow-ups on file.  No future appointments.  Allie BossierMyra C Zyriah Mask, MD

## 2018-01-20 LAB — HEMOGLOBIN A1C
EAG (MMOL/L): 5.4 (calc)
HEMOGLOBIN A1C: 5 %{Hb} (ref <5.7)
Mean Plasma Glucose: 97 (calc)

## 2018-02-23 ENCOUNTER — Encounter (HOSPITAL_COMMUNITY): Payer: Self-pay

## 2018-03-03 ENCOUNTER — Encounter (HOSPITAL_COMMUNITY): Payer: Self-pay

## 2018-03-03 ENCOUNTER — Ambulatory Visit (HOSPITAL_COMMUNITY)
Admission: RE | Admit: 2018-03-03 | Discharge: 2018-03-03 | Disposition: A | Discharge status: Home / Self Care | Payer: BC Managed Care – PPO | Source: Ambulatory Visit | Attending: Obstetrics & Gynecology | Admitting: Obstetrics & Gynecology

## 2018-03-03 DIAGNOSIS — Z3480 Encounter for supervision of other normal pregnancy, unspecified trimester: Secondary | ICD-10-CM | POA: Diagnosis present

## 2018-03-03 DIAGNOSIS — Z3A19 19 weeks gestation of pregnancy: Secondary | ICD-10-CM | POA: Diagnosis not present

## 2018-03-03 DIAGNOSIS — Z3483 Encounter for supervision of other normal pregnancy, third trimester: Secondary | ICD-10-CM | POA: Diagnosis not present

## 2018-03-03 DIAGNOSIS — Z363 Encounter for antenatal screening for malformations: Secondary | ICD-10-CM | POA: Diagnosis not present

## 2018-03-03 DIAGNOSIS — O09292 Supervision of pregnancy with other poor reproductive or obstetric history, second trimester: Secondary | ICD-10-CM

## 2018-03-10 ENCOUNTER — Encounter: Payer: BC Managed Care – PPO | Admitting: Certified Nurse Midwife

## 2018-03-10 ENCOUNTER — Ambulatory Visit (INDEPENDENT_AMBULATORY_CARE_PROVIDER_SITE_OTHER): Payer: BC Managed Care – PPO | Admitting: Certified Nurse Midwife

## 2018-03-10 ENCOUNTER — Encounter: Payer: Self-pay | Admitting: Certified Nurse Midwife

## 2018-03-10 VITALS — BP 116/70 | HR 98 | Wt 182.0 lb

## 2018-03-10 DIAGNOSIS — Z3480 Encounter for supervision of other normal pregnancy, unspecified trimester: Secondary | ICD-10-CM

## 2018-03-10 DIAGNOSIS — Z3482 Encounter for supervision of other normal pregnancy, second trimester: Secondary | ICD-10-CM

## 2018-03-10 DIAGNOSIS — Z8759 Personal history of other complications of pregnancy, childbirth and the puerperium: Secondary | ICD-10-CM

## 2018-03-10 NOTE — Progress Notes (Signed)
   PRENATAL VISIT NOTE  Subjective:  Danielle Rubio is a 32 y.o. [phone] at [redacted]w[redacted]d being seen today for ongoing prenatal care.  She is currently monitored for the following issues for this high-risk pregnancy and has History of IUFD and Supervision of other normal pregnancy, antepartum on their problem list.  Patient reports no complaints.  Contractions: Not present. Vag. Bleeding: None.  Movement: Present. Denies leaking of fluid.   The following portions of the patient's history were reviewed and updated as appropriate: allergies, current medications, past family history, past medical history, past social history, past surgical history and problem list. Problem list updated.  Objective:   Vitals:   03/10/18 1600  BP: 116/70  Pulse: 98  Weight: 182 lb (82.6 kg)    Fetal Status: Fetal Heart Rate (bpm): 145 Fundal Height: 20 cm Movement: Present     General:  Alert, oriented and cooperative. Patient is in no acute distress.  Skin: Skin is warm and dry. No rash noted.   Cardiovascular: Normal heart rate noted  Respiratory: Normal respiratory effort, no problems with respiration noted  Abdomen: Soft, gravid, appropriate for gestational age.  Pain/Pressure: Absent     Pelvic: Cervical exam deferred        Extremities: Normal range of motion.  Edema: None  Mental Status: Normal mood and affect. Normal behavior. Normal judgment and thought content.   Assessment and Plan:  Pregnancy: [id number] at [redacted]w[redacted]d  1. Supervision of other normal pregnancy, antepartum - Patient doing well, no complaints denies abdominal pain or vaginal bleeding   2. History of IUFD - Hx of IUFD at 36 weeks  - Anticipatory guidance on antenatal screening for IUFD initiating at 34 weeks, patient concerned about extra US/NST appointments due to living in [place] and having to drive 45 minutes for appointments. Discussed with patient the recommendations of antenatal screening and answered patient questions. Patient  plans to discuss recommendations with husband.   Preterm labor symptoms and general obstetric precautions including but not limited to vaginal bleeding, contractions, leaking of fluid and fetal movement were reviewed in detail with the patient. Please refer to After Visit Summary for other counseling recommendations.  Return in about 4 weeks (around 04/07/2018) for ROB.  Future Appointments  Date Time Provider Department Center  04/07/2018  4:00 PM Leftwich-Kirby, Wilmer Floor, CNM [facility]    Sharyon Cable, CNM

## 2018-03-10 NOTE — Patient Instructions (Signed)
Second Trimester of Pregnancy The second trimester is from week 13 through week 28, month 4 through 6. This is often the time in pregnancy that you feel your best. Often times, morning sickness has lessened or quit. You may have more energy, and you may get hungry more often. Your unborn baby (fetus) is growing rapidly. At the end of the sixth month, he or she is about 9 inches long and weighs about 1 pounds. You will likely feel the baby move (quickening) between 18 and 20 weeks of pregnancy. Follow these instructions at home:  Avoid all smoking, herbs, and alcohol. Avoid drugs not approved by your doctor.  Do not use any tobacco products, including cigarettes, chewing tobacco, and electronic cigarettes. If you need help quitting, ask your doctor. You may get counseling or other support to help you quit.  Only take medicine as told by your doctor. Some medicines are safe and some are not during pregnancy.  Exercise only as told by your doctor. Stop exercising if you start having cramps.  Eat regular, healthy meals.  Wear a good support bra if your breasts are tender.  Do not use hot tubs, steam rooms, or saunas.  Wear your seat belt when driving.  Avoid raw meat, uncooked cheese, and liter boxes and soil used by cats.  Take your prenatal vitamins.  Take 1500-2000 milligrams of calcium daily starting at the 20th week of pregnancy until you deliver your baby.  Try taking medicine that helps you poop (stool softener) as needed, and if your doctor approves. Eat more fiber by eating fresh fruit, vegetables, and whole grains. Drink enough fluids to keep your pee (urine) clear or pale yellow.  Take warm water baths (sitz baths) to soothe pain or discomfort caused by hemorrhoids. Use hemorrhoid cream if your doctor approves.  If you have puffy, bulging veins (varicose veins), wear support hose. Raise (elevate) your feet for 15 minutes, 3-4 times a day. Limit salt in your diet.  Avoid heavy  lifting, wear low heals, and sit up straight.  Rest with your legs raised if you have leg cramps or low back pain.  Visit your dentist if you have not gone during your pregnancy. Use a soft toothbrush to brush your teeth. Be gentle when you floss.  You can have sex (intercourse) unless your doctor tells you not to.  Go to your doctor visits. Get help if:  You feel dizzy.  You have mild cramps or pressure in your lower belly (abdomen).  You have a nagging pain in your belly area.  You continue to feel sick to your stomach (nauseous), throw up (vomit), or have watery poop (diarrhea).  You have bad smelling fluid coming from your vagina.  You have pain with peeing (urination). Get help right away if:  You have a fever.  You are leaking fluid from your vagina.  You have spotting or bleeding from your vagina.  You have severe belly cramping or pain.  You lose or gain weight rapidly.  You have trouble catching your breath and have chest pain.  You notice sudden or extreme puffiness (swelling) of your face, hands, ankles, feet, or legs.  You have not felt the baby move in over an hour.  You have severe headaches that do not go away with medicine.  You have vision changes. This information is not intended to replace advice given to you by your health care provider. Make sure you discuss any questions you have with your health care   provider. Document Released: 07/10/2009 Document Revised: 09/21/2015 Document Reviewed: 06/16/2012 Elsevier Interactive Patient Education  2017 Else[employer] Inc[employer]  

## 2018-04-07 ENCOUNTER — Ambulatory Visit (INDEPENDENT_AMBULATORY_CARE_PROVIDER_SITE_OTHER): Payer: BC Managed Care – PPO | Admitting: Advanced Practice Midwife

## 2018-04-07 VITALS — BP 110/76 | HR 101 | Wt 188.0 lb

## 2018-04-07 DIAGNOSIS — Z3482 Encounter for supervision of other normal pregnancy, second trimester: Secondary | ICD-10-CM

## 2018-04-07 DIAGNOSIS — Z3480 Encounter for supervision of other normal pregnancy, unspecified trimester: Secondary | ICD-10-CM

## 2018-04-07 DIAGNOSIS — Z8759 Personal history of other complications of pregnancy, childbirth and the puerperium: Secondary | ICD-10-CM

## 2018-04-07 NOTE — Patient Instructions (Signed)

## 2018-04-07 NOTE — Progress Notes (Signed)
   PRENATAL VISIT NOTE  Subjective:  Danielle Rubio is a 32 y.o. [phone] at 1988w0d being seen today for ongoing prenatal care.  She is currently monitored for the following issues for this low-risk pregnancy and has History of IUFD and Supervision of other normal pregnancy, antepartum on their problem list.  Patient reports no complaints.  Contractions: Not present. Vag. Bleeding: None.  Movement: Present. Denies leaking of fluid.   The following portions of the patient's history were reviewed and updated as appropriate: allergies, current medications, past family history, past medical history, past social history, past surgical history and problem list. Problem list updated.  Objective:   Vitals:   04/07/18 1552  BP: 110/76  Pulse: (!) 101  Weight: 85.3 kg    Fetal Status: Fetal Heart Rate (bpm): 143   Movement: Present     General:  Alert, oriented and cooperative. Patient is in no acute distress.  Skin: Skin is warm and dry. No rash noted.   Cardiovascular: Normal heart rate noted  Respiratory: Normal respiratory effort, no problems with respiration noted  Abdomen: Soft, gravid, appropriate for gestational age.  Pain/Pressure: Absent     Pelvic: Cervical exam deferred        Extremities: Normal range of motion.  Edema: None  Mental Status: Normal mood and affect. Normal behavior. Normal judgment and thought content.   Assessment and Plan:  Pregnancy: [id number] at 5088w0d  1. History of IUFD --At 36 weeks 03/2017.  Likely cord accident. Discussed recommended antenatal testing and ultrasounds with pt.  She prefers not to do antenatal testing or growth US unless size date discrepancy or other indication. --She had waterbirth with all 3 deliveries, including IUFD and desires the same this pregnancy.  2. Supervision of other normal pregnancy, antepartum --Anticipatory guidance about next visits/weeks of pregnancy given.  Preterm labor symptoms and general obstetric precautions  including but not limited to vaginal bleeding, contractions, leaking of fluid and fetal movement were reviewed in detail with the patient. Please refer to After Visit Summary for other counseling recommendations.  No follow-ups on file.  No future appointments.  Sharen CounterLisa Leftwich-Kirby, CNM

## 2018-04-29 NOTE — L&D Delivery Note (Signed)
OB/[facility]N Faculty Practice Delivery Note  Kellan Deshotel is a 33 y.o. A2N0539 s/p SVD at [redacted]w[redacted]d. She was admitted for spontaneous onset of labor, SROM.   ROM: 3h 38m with clear fluid - though meconium noted at time of delivery GBS Status: negative Maximum Maternal Temperature: Temp (24hrs), Avg:98.5 F (36.9 C), Min:98.5 F (36.9 C), Max:98.5 F (36.9 C)  Labor Progress: . Admitted in active labor . Rapidly progressed to complete  Delivery Date/Time: 08/01/18 at 2239 Delivery: Called to room and patient was complete and pushing. Head delivered ROA with compound hand presentation. No nuchal cord present. Shoulder and body delivered in usual fashion. Infant with spontaneous cry, placed on mother's abdomen, dried and stimulated. Cord clamped x 2 after 3-minute delay, and cut by father of baby. Cord blood drawn. Placenta delivered spontaneously with gentle cord traction. Fundus firm with massage and Pitocin. Labia, perineum, vagina, and cervix inspected inspected with no lacerations.   Placenta: spontaneous, intact, 3-vessel (to be discarded) Complications: meconium stained fluid  Lacerations: none EBL: 966cc per Triton (though likely slightly overestimated given weighed pad also with fair amount of fluid) - given IM pitocin, IM methergine Analgesia: none  Postpartum Planning [x]  message to sent to schedule follow-up  [x]  vaccines UTD  Infant: Vigorous infant  APGARs 8, 9  4190g  Ritta Hammes S. Earlene Plater, DO OB/GYN Fellow, Faculty Practice

## 2018-05-05 ENCOUNTER — Ambulatory Visit (INDEPENDENT_AMBULATORY_CARE_PROVIDER_SITE_OTHER): Payer: BC Managed Care – PPO | Admitting: Certified Nurse Midwife

## 2018-05-05 ENCOUNTER — Encounter: Payer: BC Managed Care – PPO | Admitting: Certified Nurse Midwife

## 2018-05-05 ENCOUNTER — Encounter: Payer: Self-pay | Admitting: Certified Nurse Midwife

## 2018-05-05 VITALS — BP 101/60 | HR 87 | Wt 189.0 lb

## 2018-05-05 DIAGNOSIS — Z8759 Personal history of other complications of pregnancy, childbirth and the puerperium: Secondary | ICD-10-CM

## 2018-05-05 DIAGNOSIS — Z3480 Encounter for supervision of other normal pregnancy, unspecified trimester: Secondary | ICD-10-CM

## 2018-05-05 DIAGNOSIS — Z3483 Encounter for supervision of other normal pregnancy, third trimester: Secondary | ICD-10-CM

## 2018-05-05 NOTE — Patient Instructions (Signed)

## 2018-05-05 NOTE — Progress Notes (Signed)
   PRENATAL VISIT NOTE  Subjective:  Danielle Rubio is a 33 y.o. [phone] at [redacted]w[redacted]d being seen today for ongoing prenatal care.  She is currently monitored for the following issues for this low-risk pregnancy and has History of IUFD and Supervision of other normal pregnancy, antepartum on their problem list.  Patient reports no complaints.  Contractions: Not present. Vag. Bleeding: None.  Movement: Present. Denies leaking of fluid.   The following portions of the patient's history were reviewed and updated as appropriate: allergies, current medications, past family history, past medical history, past social history, past surgical history and problem list. Problem list updated.  Objective:   Vitals:   05/05/18 0812  BP: 101/60  Pulse: 87  Weight: 189 lb (85.7 kg)    Fetal Status: Fetal Heart Rate (bpm): 138 Fundal Height: 26 cm Movement: Present     General:  Alert, oriented and cooperative. Patient is in no acute distress.  Skin: Skin is warm and dry. No rash noted.   Cardiovascular: Normal heart rate noted  Respiratory: Normal respiratory effort, no problems with respiration noted  Abdomen: Soft, gravid, appropriate for gestational age.  Pain/Pressure: Absent     Pelvic: Cervical exam deferred        Extremities: Normal range of motion.  Edema: None  Mental Status: Normal mood and affect. Normal behavior. Normal judgment and thought content.   Assessment and Plan:  Pregnancy: G4P2102 at [redacted]w[redacted]d  1. Supervision of other normal pregnancy, antepartum - Patient doing well, no complaints  - Anticipatory guidance on upcoming appointments - Plans waterbirth   - 2Hr GTT w/ 1 Hr Carpenter 75 g - CBC - RPR - HIV antibody (with reflex)  2. History of IUFD - Declines weekly antenatal testing, will possibly consider NST at time of appointments but is unsure at this time.  - Patient cancelled follow up US appointment that was scheduled for fetal growth.  - Prefers not to do testing or  growth [place] unless size date discrepancy or other indication.   Preterm labor symptoms and general obstetric precautions including but not limited to vaginal bleeding, contractions, leaking of fluid and fetal movement were reviewed in detail with the patient. Please refer to After Visit Summary for other counseling recommendations.  Return in about 2 weeks (around 05/19/2018) for ROB.  Future Appointments  Date Time Provider Department Center  05/19/2018  3:45 PM Sharyon Cable, CNM [facility]    Sharyon Cable, CNM

## 2018-05-06 LAB — CBC
HCT: 31.3 % — ABNORMAL LOW (ref 35.0–45.0)
Hemoglobin: 10.9 g/dL — ABNORMAL LOW (ref 11.7–15.5)
MCH: 32.1 pg (ref 27.0–33.0)
MCHC: 34.8 g/dL (ref 32.0–36.0)
MCV: 92.1 fL (ref 80.0–100.0)
MPV: 11.3 fL (ref 7.5–12.5)
Platelets: 204 10*3/uL (ref 140–400)
RBC: 3.4 10*6/uL — ABNORMAL LOW (ref 3.80–5.10)
RDW: 12.1 % (ref 11.0–15.0)
WBC: 7.6 10*3/uL (ref 3.8–10.8)

## 2018-05-06 LAB — RPR: RPR Ser Ql: NONREACTIVE

## 2018-05-06 LAB — 2HR GTT W 1 HR, CARPENTER, 75 G
Glucose, 1 Hr, Gest: 123 mg/dL (ref 65–179)
Glucose, 2 Hr, Gest: 88 mg/dL (ref 65–152)
Glucose, Fasting, Gest: 75 mg/dL (ref 65–91)

## 2018-05-06 LAB — HIV ANTIBODY (ROUTINE TESTING W REFLEX): HIV 1&2 Ab, 4th Generation: NONREACTIVE

## 2018-05-19 ENCOUNTER — Encounter: Payer: Self-pay | Admitting: Certified Nurse Midwife

## 2018-05-19 ENCOUNTER — Ambulatory Visit (INDEPENDENT_AMBULATORY_CARE_PROVIDER_SITE_OTHER): Payer: BC Managed Care – PPO | Admitting: Certified Nurse Midwife

## 2018-05-19 VITALS — BP 108/72 | HR 105 | Wt 192.0 lb

## 2018-05-19 DIAGNOSIS — Z8759 Personal history of other complications of pregnancy, childbirth and the puerperium: Secondary | ICD-10-CM

## 2018-05-19 DIAGNOSIS — Z3483 Encounter for supervision of other normal pregnancy, third trimester: Secondary | ICD-10-CM

## 2018-05-19 DIAGNOSIS — Z3480 Encounter for supervision of other normal pregnancy, unspecified trimester: Secondary | ICD-10-CM

## 2018-05-19 NOTE — Patient Instructions (Signed)
Fetal Movement Counts Patient Name: ________________________________________________ Patient Due Date: ____________________ What is a fetal movement count?  A fetal movement count is the number of times that you feel your baby move during a certain amount of time. This may also be called a fetal kick count. A fetal movement count is recommended for every pregnant woman. You may be asked to start counting fetal movements as early as week 28 of your pregnancy. Pay attention to when your baby is most active. You may notice your baby's sleep and wake cycles. You may also notice things that make your baby move more. You should do a fetal movement count:  When your baby is normally most active.  At the same time each day. A good time to count movements is while you are resting, after having something to eat and drink. How do I count fetal movements? 1. Find a quiet, comfortable area. Sit, or lie down on your side. 2. Write down the date, the start time and stop time, and the number of movements that you felt between those two times. Take this information with you to your health care visits. 3. For 2 hours, count kicks, flutters, swishes, rolls, and jabs. You should feel at least 10 movements during 2 hours. 4. You may stop counting after you have felt 10 movements. 5. If you do not feel 10 movements in 2 hours, have something to eat and drink. Then, keep resting and counting for 1 hour. If you feel at least 4 movements during that hour, you may stop counting. Contact a health care provider if:  You feel fewer than 4 movements in 2 hours.  Your baby is not moving like he or she usually does. Date: ____________ Start time: ____________ Stop time: ____________ Movements: ____________ Date: ____________ Start time: ____________ Stop time: ____________ Movements: ____________ Date: ____________ Start time: ____________ Stop time: ____________ Movements: ____________ Date: ____________ Start time:  ____________ Stop time: ____________ Movements: ____________ Date: ____________ Start time: ____________ Stop time: ____________ Movements: ____________ Date: ____________ Start time: ____________ Stop time: ____________ Movements: ____________ Date: ____________ Start time: ____________ Stop time: ____________ Movements: ____________ Date: ____________ Start time: ____________ Stop time: ____________ Movements: ____________ Date: ____________ Start time: ____________ Stop time: ____________ Movements: ____________ This information is not intended to replace advice given to you by your health care provider. Make sure you discuss any questions you have with your health care provider. Document Released: 05/15/2006 Document Revised: 12/13/2015 Document Reviewed: 05/25/2015 Elsevier Interactive Patient Education  2019 [employer]. Third Trimester of Pregnancy  The third trimester is from week 28 through week 40 (months 7 through 9). This trimester is when your unborn baby (fetus) is growing very fast. At the end of the ninth month, the unborn baby is about 20 inches in length. It weighs about 6-10 pounds. Follow these instructions at home: Medicines  Take over-the-counter and prescription medicines only as told by your doctor. Some medicines are safe and some medicines are not safe during pregnancy.  Take a prenatal vitamin that contains at least 600 micrograms (mcg) of folic acid.  If you have trouble pooping (constipation), take medicine that will make your stool soft (stool softener) if your doctor approves. Eating and drinking   Eat regular, healthy meals.  Avoid raw meat and uncooked cheese.  If you get low calcium from the food you eat, talk to your doctor about taking a daily calcium supplement.  Eat four or five small meals rather than three large meals a day.  Avoid foods that are high in fat and sugars, such as fried and sweet foods.  To prevent constipation: ? Eat foods  that are high in fiber, like fresh fruits and vegetables, whole grains, and beans. ? Drink enough fluids to keep your pee (urine) clear or pale yellow. Activity  Exercise only as told by your doctor. Stop exercising if you start to have cramps.  Avoid heavy lifting, wear low heels, and sit up straight.  Do not exercise if it is too hot, too humid, or if you are in a place of great height (high altitude).  You may continue to have sex unless your doctor tells you not to. Relieving pain and discomfort  Wear a good support bra if your breasts are tender.  Take frequent breaks and rest with your legs raised if you have leg cramps or low back pain.  Take warm water baths (sitz baths) to soothe pain or discomfort caused by hemorrhoids. Use hemorrhoid cream if your doctor approves.  If you develop puffy, bulging veins (varicose veins) in your legs: ? Wear support hose or compression stockings as told by your doctor. ? Raise (elevate) your feet for 15 minutes, 3-4 times a day. ? Limit salt in your food. Safety  Wear your seat belt when driving.  Make a list of emergency phone numbers, including numbers for family, friends, the hospital, and police and fire departments. Preparing for your baby's arrival To prepare for the arrival of your baby:  Take prenatal classes.  Practice driving to the hospital.  Visit the hospital and tour the maternity area.  Talk to your work about taking leave once the baby comes.  Pack your hospital bag.  Prepare the baby's room.  Go to your doctor visits.  Buy a rear-facing car seat. Learn how to install it in your car. General instructions  Do not use hot tubs, steam rooms, or saunas.  Do not use any products that contain nicotine or tobacco, such as cigarettes and e-cigarettes. If you need help quitting, ask your doctor.  Do not drink alcohol.  Do not douche or use tampons or scented sanitary pads.  Do not cross your legs for long periods  of time.  Do not travel for long distances unless you must. Only do so if your doctor says it is okay.  Visit your dentist if you have not gone during your pregnancy. Use a soft toothbrush to brush your teeth. Be gentle when you floss.  Avoid cat litter boxes and soil used by cats. These carry germs that can cause birth defects in the baby and can cause a loss of your baby (miscarriage) or stillbirth.  Keep all your prenatal visits as told by your doctor. This is important. Contact a doctor if:  You are not sure if you are in labor or if your water has broken.  You are dizzy.  You have mild cramps or pressure in your lower belly.  You have a nagging pain in your belly area.  You continue to feel sick to your stomach, you throw up, or you have watery poop.  You have bad smelling fluid coming from your vagina.  You have pain when you pee. Get help right away if:  You have a fever.  You are leaking fluid from your vagina.  You are spotting or bleeding from your vagina.  You have severe belly cramps or pain.  You lose or gain weight quickly.  You have trouble catching your breath and have  chest pain.  You notice sudden or extreme puffiness (swelling) of your face, hands, ankles, feet, or legs.  You have not felt the baby move in over an hour.  You have severe headaches that do not go away with medicine.  You have trouble seeing.  You are leaking, or you are having a gush of fluid, from your vagina before you are 37 weeks.  You have regular belly spasms (contractions) before you are 37 weeks. Summary  The third trimester is from week 28 through week 40 (months 7 through 9). This time is when your unborn baby is growing very fast.  Follow your doctor's advice about medicine, food, and activity.  Get ready for the arrival of your baby by taking prenatal classes, getting all the baby items ready, preparing the baby's room, and visiting your doctor to be checked.  Get  help right away if you are bleeding from your vagina, or you have chest pain and trouble catching your breath, or if you have not felt your baby move in over an hour. This information is not intended to replace advice given to you by your health care provider. Make sure you discuss any questions you have with your health care provider. Document Released: 07/10/2009 Document Revised: 05/21/2016 Document Reviewed: 05/21/2016 Elsevier Interactive Patient Education  2019 [employer].

## 2018-05-19 NOTE — Progress Notes (Signed)
   PRENATAL VISIT NOTE  Subjective:  Danielle Rubio is a 33 y.o. [phone] at [redacted]w[redacted]d being seen today for ongoing prenatal care.  She is currently monitored for the following issues for this low-risk pregnancy and has History of IUFD and Supervision of other normal pregnancy, antepartum on their problem list.  Patient reports no complaints.  Contractions: Not present. Vag. Bleeding: None.  Movement: Present. Denies leaking of fluid.   The following portions of the patient's history were reviewed and updated as appropriate: allergies, current medications, past family history, past medical history, past social history, past surgical history and problem list. Problem list updated.  Objective:   Vitals:   05/19/18 1546  BP: 108/72  Pulse: (!) 105  Weight: 192 lb (87.1 kg)    Fetal Status: Fetal Heart Rate (bpm): 134   Movement: Present     General:  Alert, oriented and cooperative. Patient is in no acute distress.  Skin: Skin is warm and dry. No rash noted.   Cardiovascular: Normal heart rate noted  Respiratory: Normal respiratory effort, no problems with respiration noted  Abdomen: Soft, gravid, appropriate for gestational age.  Pain/Pressure: Absent     Pelvic: Cervical exam deferred        Extremities: Normal range of motion.  Edema: None  Mental Status: Normal mood and affect. Normal behavior. Normal judgment and thought content.   Assessment and Plan:  Pregnancy: [id number] at [redacted]w[redacted]d  1. Supervision of other normal pregnancy, antepartum Patient doing well without complaints. Reviewed results of 2 hour GTT.   Anticipatory guidance on upcoming appoinments.Previously had 3 water births, including IUFD, and desires water birth this time as well. Routine prenatal care.   F/U in 2 weeks.  2. History of IUFD IUFD occurred at 36 weeks in 03/2017, likely from a cord accident.  Discussed recommended antenatal testing and ultrasounds with patient, but declines at this time.  Will consider if  there is a size date discrepancy or other indication.  Preterm labor symptoms and general obstetric precautions including but not limited to vaginal bleeding, contractions, leaking of fluid and fetal movement were reviewed in detail with the patient. Please refer to After Visit Summary for other counseling recommendations.  Return in about 2 weeks (around 06/02/2018) for ROB.  No future appointments.  Louis Matte Earlene Plater, SNP

## 2018-06-02 ENCOUNTER — Encounter: Payer: Self-pay | Admitting: Certified Nurse Midwife

## 2018-06-02 ENCOUNTER — Ambulatory Visit (INDEPENDENT_AMBULATORY_CARE_PROVIDER_SITE_OTHER): Payer: BC Managed Care – PPO | Admitting: Certified Nurse Midwife

## 2018-06-02 VITALS — BP 118/73 | HR 106 | Wt 194.0 lb

## 2018-06-02 DIAGNOSIS — Z3480 Encounter for supervision of other normal pregnancy, unspecified trimester: Secondary | ICD-10-CM

## 2018-06-02 DIAGNOSIS — Z3483 Encounter for supervision of other normal pregnancy, third trimester: Secondary | ICD-10-CM

## 2018-06-02 DIAGNOSIS — Z8759 Personal history of other complications of pregnancy, childbirth and the puerperium: Secondary | ICD-10-CM

## 2018-06-02 DIAGNOSIS — O09293 Supervision of pregnancy with other poor reproductive or obstetric history, third trimester: Secondary | ICD-10-CM

## 2018-06-02 NOTE — Progress Notes (Signed)
   PRENATAL VISIT NOTE  Subjective:  Danielle Rubio is a 33 y.o. [phone] at [redacted]w[redacted]d being seen today for ongoing prenatal care.  She is currently monitored for the following issues for this low-risk pregnancy and has History of IUFD and Supervision of other normal pregnancy, antepartum on their problem list.  Patient reports no complaints.  Contractions: Not present. Vag. Bleeding: None.  Movement: Present. Denies leaking of fluid.   The following portions of the patient's history were reviewed and updated as appropriate: allergies, current medications, past family history, past medical history, past social history, past surgical history and problem list. Problem list updated.  Objective:  Vitals:   06/02/18 1547  BP: 118/73  Pulse: (!) 106  Weight: 194 lb (88 kg)    Fetal Status: Fetal Heart Rate (bpm): 138 Fundal Height: 34 cm Movement: Present     General:  Alert, oriented and cooperative. Patient is in no acute distress.  Skin: Skin is warm and dry. No rash noted.   Cardiovascular: Normal heart rate noted  Respiratory: Normal respiratory effort, no problems with respiration noted  Abdomen: Soft, gravid, appropriate for gestational age.  Pain/Pressure: Absent     Pelvic: Cervical exam deferred        Extremities: Normal range of motion.  Edema: None  Mental Status: Normal mood and affect. Normal behavior. Normal judgment and thought content.   Assessment and Plan:  Pregnancy: L2G4010 at [redacted]w[redacted]d  1. Supervision of other normal pregnancy, antepartum - Patient doing well, no complaints  - Anticipatory guidance on upcoming appointments   2. History of IUFD - Educated and discussed antenatal screening that should be started today and continue until delivery - Patient continues to decline antenatal screening at this time   Preterm labor symptoms and general obstetric precautions including but not limited to vaginal bleeding, contractions, leaking of fluid and fetal movement were  reviewed in detail with the patient. Please refer to After Visit Summary for other counseling recommendations.  Return in about 2 weeks (around 06/16/2018) for ROB.  Future Appointments  Date Time Provider Department Center  06/16/2018  3:45 PM Sharyon Cable, CNM [facility]    Sharyon Cable, CNM

## 2018-06-16 ENCOUNTER — Ambulatory Visit (INDEPENDENT_AMBULATORY_CARE_PROVIDER_SITE_OTHER): Payer: BC Managed Care – PPO | Admitting: Certified Nurse Midwife

## 2018-06-16 ENCOUNTER — Encounter: Payer: Self-pay | Admitting: Certified Nurse Midwife

## 2018-06-16 VITALS — BP 126/81 | HR 121 | Wt 194.0 lb

## 2018-06-16 DIAGNOSIS — Z3A34 34 weeks gestation of pregnancy: Secondary | ICD-10-CM

## 2018-06-16 DIAGNOSIS — Z8759 Personal history of other complications of pregnancy, childbirth and the puerperium: Secondary | ICD-10-CM

## 2018-06-16 DIAGNOSIS — Z3480 Encounter for supervision of other normal pregnancy, unspecified trimester: Secondary | ICD-10-CM

## 2018-06-16 DIAGNOSIS — Z3483 Encounter for supervision of other normal pregnancy, third trimester: Secondary | ICD-10-CM

## 2018-06-16 NOTE — Patient Instructions (Signed)

## 2018-06-16 NOTE — Progress Notes (Signed)
   PRENATAL VISIT NOTE  Subjective:  Danielle Rubio is a 33 y.o. [phone] at [redacted]w[redacted]d being seen today for ongoing prenatal care.  She is currently monitored for the following issues for this high-risk pregnancy and has History of IUFD and Supervision of other normal pregnancy, antepartum on their problem list.  Patient reports no complaints.  Contractions: Not present. Vag. Bleeding: None.  Movement: Present. Denies leaking of fluid.   The following portions of the patient's history were reviewed and updated as appropriate: allergies, current medications, past family history, past medical history, past social history, past surgical history and problem list. Problem list updated.  Objective:   Vitals:   06/16/18 1529  BP: 126/81  Pulse: (!) 121  Weight: 194 lb (88 kg)    Fetal Status: Fetal Heart Rate (bpm): 132 Fundal Height: 35 cm Movement: Present     General:  Alert, oriented and cooperative. Patient is in no acute distress.  Skin: Skin is warm and dry. No rash noted.   Cardiovascular: Normal heart rate noted  Respiratory: Normal respiratory effort, no problems with respiration noted  Abdomen: Soft, gravid, appropriate for gestational age.  Pain/Pressure: Absent     Pelvic: Cervical exam deferred        Extremities: Normal range of motion.  Edema: None  Mental Status: Normal mood and affect. Normal behavior. Normal judgment and thought content.   Assessment and Plan:  Pregnancy: S2G3151 at [redacted]w[redacted]d  1. Supervision of other normal pregnancy, antepartum - Patient doing well no complaints - Anticipatory guidance on upcoming appointments  - Discussed GBS screening at next appointment, patient verbalizes that she does not want an IV during labor if she does not have to. Discussed as long as GBS negative and baby looks great on monitor in order to have waterbirth she does not need IV   2. History of IUFD - Patient declines antenatal testing  - prefers not to do testing or additional  [place] unless size date discrepancy or other indication   Preterm labor symptoms and general obstetric precautions including but not limited to vaginal bleeding, contractions, leaking of fluid and fetal movement were reviewed in detail with the patient. Please refer to After Visit Summary for other counseling recommendations.  Return in about 2 weeks (around 06/30/2018) for ROB.  Future Appointments  Date Time Provider Department Center  06/30/2018  4:00 PM Sharyon Cable, CNM [facility]    Sharyon Cable, CNM

## 2018-06-30 ENCOUNTER — Encounter: Payer: Self-pay | Admitting: Certified Nurse Midwife

## 2018-06-30 ENCOUNTER — Ambulatory Visit (INDEPENDENT_AMBULATORY_CARE_PROVIDER_SITE_OTHER): Payer: BC Managed Care – PPO | Admitting: Certified Nurse Midwife

## 2018-06-30 VITALS — BP 107/65 | HR 89 | Wt 197.0 lb

## 2018-06-30 DIAGNOSIS — Z113 Encounter for screening for infections with a predominantly sexual mode of transmission: Secondary | ICD-10-CM

## 2018-06-30 DIAGNOSIS — Z3A36 36 weeks gestation of pregnancy: Secondary | ICD-10-CM

## 2018-06-30 DIAGNOSIS — Z8759 Personal history of other complications of pregnancy, childbirth and the puerperium: Secondary | ICD-10-CM

## 2018-06-30 DIAGNOSIS — Z029 Encounter for administrative examinations, unspecified: Secondary | ICD-10-CM

## 2018-06-30 DIAGNOSIS — Z3483 Encounter for supervision of other normal pregnancy, third trimester: Secondary | ICD-10-CM

## 2018-06-30 DIAGNOSIS — Z3480 Encounter for supervision of other normal pregnancy, unspecified trimester: Secondary | ICD-10-CM

## 2018-06-30 LAB — OB RESULTS CONSOLE GBS: GBS: NEGATIVE

## 2018-06-30 NOTE — Progress Notes (Signed)
   PRENATAL VISIT NOTE  Subjective:  Danielle Rubio is a 33 y.o. [phone] at [redacted]w[redacted]d being seen today for ongoing prenatal care.  She is currently monitored for the following issues for this high-risk pregnancy and has History of IUFD and Supervision of other normal pregnancy, antepartum on their problem list.  Patient reports no complaints.  Contractions: Not present. Vag. Bleeding: None.  Movement: Present. Denies leaking of fluid.   The following portions of the patient's history were reviewed and updated as appropriate: allergies, current medications, past family history, past medical history, past social history, past surgical history and problem list. Problem list updated.  Objective:   Vitals:   06/30/18 1529  BP: 107/65  Pulse: 89  Weight: 197 lb (89.4 kg)    Fetal Status: Fetal Heart Rate (bpm): 131 Fundal Height: 36 cm Movement: Present     General:  Alert, oriented and cooperative. Patient is in no acute distress.  Skin: Skin is warm and dry. No rash noted.   Cardiovascular: Normal heart rate noted  Respiratory: Normal respiratory effort, no problems with respiration noted  Abdomen: Soft, gravid, appropriate for gestational age.  Pain/Pressure: Present     Pelvic: Cervical exam performed      2/Thick/-3, Vertex  Extremities: Normal range of motion.  Edema: None  Mental Status: Normal mood and affect. Normal behavior. Normal judgment and thought content.   Assessment and Plan:  Pregnancy: [id number] at [redacted]w[redacted]d  1. Supervision of other normal pregnancy, antepartum Vaginal exam and GBS swab performed today.  Routine prenatal care.  Anticipatory guidance given.  F/U in 1 week. - Culture, beta strep (group b only) - Cervicovaginal ancillary only( [place])  2. History of IUFD Patient declined antenatal testing.  Prefers not to do testing or additional [place] unless size date discrepancy or other indication.  Preterm labor symptoms and general obstetric precautions including but  not limited to vaginal bleeding, contractions, leaking of fluid and fetal movement were reviewed in detail with the patient. Please refer to After Visit Summary for other counseling recommendations.  Return in about 1 week (around 07/07/2018) for ROB.  Future Appointments  Date Time Provider Department Center  07/07/2018  4:00 PM Leftwich-Kirby, Wilmer Floor, CNM [facility]    Louis Matte. Earlene Plater, SNP

## 2018-06-30 NOTE — Patient Instructions (Signed)
Reasons to go to MAU:  1.  Contractions are  5 minutes apart or less, each last 1 minute, these have been going on for 1-2 hours, and you cannot walk or talk during them 2.  You have a large gush of fluid, or a trickle of fluid that will not stop and you have to wear a pad 3.  You have bleeding that is bright red, heavier than spotting--like menstrual bleeding (spotting can be normal in early labor or after a check of your cervix) 4.  You do not feel the baby moving like he/she normally does  

## 2018-07-02 LAB — CERVICOVAGINAL ANCILLARY ONLY
Chlamydia: NEGATIVE
Neisseria Gonorrhea: NEGATIVE

## 2018-07-03 LAB — CULTURE, BETA STREP (GROUP B ONLY)
MICRO NUMBER:: 271017
SPECIMEN QUALITY:: ADEQUATE

## 2018-07-07 ENCOUNTER — Ambulatory Visit (INDEPENDENT_AMBULATORY_CARE_PROVIDER_SITE_OTHER): Payer: BC Managed Care – PPO | Admitting: Advanced Practice Midwife

## 2018-07-07 VITALS — BP 105/70 | HR 98 | Wt 198.0 lb

## 2018-07-07 DIAGNOSIS — O26893 Other specified pregnancy related conditions, third trimester: Secondary | ICD-10-CM

## 2018-07-07 DIAGNOSIS — Z3A37 37 weeks gestation of pregnancy: Secondary | ICD-10-CM

## 2018-07-07 DIAGNOSIS — R102 Pelvic and perineal pain: Secondary | ICD-10-CM

## 2018-07-07 DIAGNOSIS — Z3480 Encounter for supervision of other normal pregnancy, unspecified trimester: Secondary | ICD-10-CM

## 2018-07-07 DIAGNOSIS — Z8759 Personal history of other complications of pregnancy, childbirth and the puerperium: Secondary | ICD-10-CM

## 2018-07-07 NOTE — Progress Notes (Signed)
   PRENATAL VISIT NOTE  Subjective:  Danielle Rubio is a 33 y.o. [phone] at [redacted]w[redacted]d being seen today for ongoing prenatal care.  She is currently monitored for the following issues for this low-risk pregnancy and has History of IUFD and Supervision of other normal pregnancy, antepartum on their problem list.  Patient reports pain at pubic bone, sharp intermittent.  Contractions: Irritability. Vag. Bleeding: None.  Movement: Present. Denies leaking of fluid.   The following portions of the patient's history were reviewed and updated as appropriate: allergies, current medications, past family history, past medical history, past social history, past surgical history and problem list.   Objective:   Vitals:   07/07/18 1529  BP: 105/70  Pulse: 98  Weight: 89.8 kg    Fetal Status: Fetal Heart Rate (bpm): 140   Movement: Present     General:  Alert, oriented and cooperative. Patient is in no acute distress.  Skin: Skin is warm and dry. No rash noted.   Cardiovascular: Normal heart rate noted  Respiratory: Normal respiratory effort, no problems with respiration noted  Abdomen: Soft, gravid, appropriate for gestational age.  Pain/Pressure: Present     Pelvic: Cervical exam deferred        Extremities: Normal range of motion.  Edema: Trace  Mental Status: Normal mood and affect. Normal behavior. Normal judgment and thought content.   Assessment and Plan:  Pregnancy: [id number] at [redacted]w[redacted]d  1. Supervision of other normal pregnancy, antepartum --Anticipatory guidance about next visits/weeks of pregnancy given. --Waterbirth consent reviewed and signed today.  2. History of IUFD --Pt declined antenatal testing this pregnancy.  3. Pelvic pain affecting pregnancy in third trimester, antepartum --Occasional sharp pain from pubic bone down into vagina.  Did not have this pain with previous pregnancies.  Baby is OP position by [place] today.  Recommend hands and knees position 2-3 times daily and the  [employer] ([email or web address]) to improve fetal position/pt comfort.  Term labor symptoms and general obstetric precautions including but not limited to vaginal bleeding, contractions, leaking of fluid and fetal movement were reviewed in detail with the patient. Please refer to After Visit Summary for other counseling recommendations.   No follow-ups on file.  Future Appointments  Date Time Provider Department Center  07/07/2018  4:00 PM Leftwich-Kirby, Wilmer Floor, CNM [facility]    Sharen Counter, CNM

## 2018-07-07 NOTE — Patient Instructions (Signed)
Labor Precautions Reasons to come to MAU:  1.  Contractions are  5 minutes apart or less, each last 1 minute, these have been going on for 1-2 hours, and you cannot walk or talk during them 2.  You have a large gush of fluid, or a trickle of fluid that will not stop and you have to wear a pad 3.  You have bleeding that is bright red, heavier than spotting--like menstrual bleeding (spotting can be normal in early labor or after a check of your cervix) 4.  You do not feel the baby moving like he/she normally does  Vaginal Delivery  Vaginal delivery means that you give birth by pushing your baby out of your birth canal (vagina). A team of health care providers will help you before, during, and after vaginal delivery. Birth experiences are unique for every woman and every pregnancy, and birth experiences vary depending on where you choose to give birth. What happens when I arrive at the birth center or hospital? Once you are in labor and have been admitted into the hospital or birth center, your health care provider may:  Review your pregnancy history and any concerns that you have.  Insert an IV into one of your veins. This may be used to give you fluids and medicines.  Check your blood pressure, pulse, temperature, and heart rate (vital signs).  Check whether your bag of water (amniotic sac) has broken (ruptured).  Talk with you about your birth plan and discuss pain control options. Monitoring Your health care provider may monitor your contractions (uterine monitoring) and your baby's heart rate (fetal monitoring). You may need to be monitored:  Often, but not continuously (intermittently).  All the time or for long periods at a time (continuously). Continuous monitoring may be needed if: ? You are taking certain medicines, such as medicine to relieve pain or make your contractions stronger. ? You have pregnancy or labor complications. Monitoring may be done by:  Placing a special  stethoscope or a handheld monitoring device on your abdomen to check your baby's heartbeat and to check for contractions.  Placing monitors on your abdomen (external monitors) to record your baby's heartbeat and the frequency and length of contractions.  Placing monitors inside your uterus through your vagina (internal monitors) to record your baby's heartbeat and the frequency, length, and strength of your contractions. Depending on the type of monitor, it may remain in your uterus or on your baby's head until birth.  Telemetry. This is a type of continuous monitoring that can be done with external or internal monitors. Instead of having to stay in bed, you are able to move around during telemetry. Physical exam Your health care provider may perform frequent physical exams. This may include:  Checking how and where your baby is positioned in your uterus.  Checking your cervix to determine: ? Whether it is thinning out (effacing). ? Whether it is opening up (dilating). What happens during labor and delivery?  Normal labor and delivery is divided into the following three stages: Stage 1  This is the longest stage of labor.  This stage can last for hours or days.  Throughout this stage, you will feel contractions. Contractions generally feel mild, infrequent, and irregular at first. They get stronger, more frequent (about every 2-3 minutes), and more regular as you move through this stage.  This stage ends when your cervix is completely dilated to 4 inches (10 cm) and completely effaced. Stage 2  This stage starts   once your cervix is completely effaced and dilated and lasts until the delivery of your baby.  This stage may last from 20 minutes to 2 hours.  This is the stage where you will feel an urge to push your baby out of your vagina.  You may feel stretching and burning pain, especially when the widest part of your baby's head passes through the vaginal opening (crowning).   Once your baby is delivered, the umbilical cord will be clamped and cut. This usually occurs after waiting a period of 1-2 minutes after delivery.  Your baby will be placed on your bare chest (skin-to-skin contact) in an upright position and covered with a warm blanket. Watch your baby for feeding cues, like rooting or sucking, and help the baby to your breast for his or her first feeding. Stage 3  This stage starts immediately after the birth of your baby and ends after you deliver the placenta.  This stage may take anywhere from 5 to 30 minutes.  After your baby has been delivered, you will feel contractions as your body expels the placenta and your uterus contracts to control bleeding. What can I expect after labor and delivery?  After labor is over, you and your baby will be monitored closely until you are ready to go home to ensure that you are both healthy. Your health care team will teach you how to care for yourself and your baby.  You and your baby will stay in the same room (rooming in) during your hospital stay. This will encourage early bonding and successful breastfeeding.  You may continue to receive fluids and medicines through an IV.  Your uterus will be checked and massaged regularly (fundal massage).  You will have some soreness and pain in your abdomen, vagina, and the area of skin between your vaginal opening and your anus (perineum).  If an incision was made near your vagina (episiotomy) or if you had some vaginal tearing during delivery, cold compresses may be placed on your episiotomy or your tear. This helps to reduce pain and swelling.  You may be given a squirt bottle to use instead of wiping when you go to the bathroom. To use the squirt bottle, follow these steps: ? Before you urinate, fill the squirt bottle with warm water. Do not use hot water. ? After you urinate, while you are sitting on the toilet, use the squirt bottle to rinse the area around your urethra  and vaginal opening. This rinses away any urine and blood. ? Fill the squirt bottle with clean water every time you use the bathroom.  It is normal to have vaginal bleeding after delivery. Wear a sanitary pad for vaginal bleeding and discharge. Summary  Vaginal delivery means that you will give birth by pushing your baby out of your birth canal (vagina).  Your health care provider may monitor your contractions (uterine monitoring) and your baby's heart rate (fetal monitoring).  Your health care provider may perform a physical exam.  Normal labor and delivery is divided into three stages.  After labor is over, you and your baby will be monitored closely until you are ready to go home. This information is not intended to replace advice given to you by your health care provider. Make sure you discuss any questions you have with your health care provider. Document Released: 01/23/2008 Document Revised: 05/20/2017 Document Reviewed: 05/20/2017 Elsevier Interactive Patient Education  2019 [employer]  

## 2018-07-14 ENCOUNTER — Other Ambulatory Visit: Payer: Self-pay

## 2018-07-14 ENCOUNTER — Ambulatory Visit (INDEPENDENT_AMBULATORY_CARE_PROVIDER_SITE_OTHER): Payer: BC Managed Care – PPO | Admitting: Certified Nurse Midwife

## 2018-07-14 ENCOUNTER — Encounter: Payer: Self-pay | Admitting: Certified Nurse Midwife

## 2018-07-14 VITALS — BP 105/66 | HR 98 | Wt 196.0 lb

## 2018-07-14 DIAGNOSIS — Z8759 Personal history of other complications of pregnancy, childbirth and the puerperium: Secondary | ICD-10-CM

## 2018-07-14 DIAGNOSIS — Z3483 Encounter for supervision of other normal pregnancy, third trimester: Secondary | ICD-10-CM

## 2018-07-14 DIAGNOSIS — Z3480 Encounter for supervision of other normal pregnancy, unspecified trimester: Secondary | ICD-10-CM

## 2018-07-14 DIAGNOSIS — Z3A38 38 weeks gestation of pregnancy: Secondary | ICD-10-CM

## 2018-07-14 NOTE — Patient Instructions (Signed)
Reasons to go to MAU:  1.  Contractions are  5 minutes apart or less, each last 1 minute, these have been going on for 1-2 hours, and you cannot walk or talk during them 2.  You have a large gush of fluid, or a trickle of fluid that will not stop and you have to wear a pad 3.  You have bleeding that is bright red, heavier than spotting--like menstrual bleeding (spotting can be normal in early labor or after a check of your cervix) 4.  You do not feel the baby moving like he/she normally does  

## 2018-07-14 NOTE — Progress Notes (Signed)
   PRENATAL VISIT NOTE  Subjective:  Danielle Rubio is a 33 y.o. [phone] at [redacted]w[redacted]d being seen today for ongoing prenatal care.  She is currently monitored for the following issues for this low-risk pregnancy and has History of IUFD and Supervision of other normal pregnancy, antepartum on their problem list.  Patient reports no complaints.  Contractions: Irritability. Vag. Bleeding: None.  Movement: Present. Denies leaking of fluid.   The following portions of the patient's history were reviewed and updated as appropriate: allergies, current medications, past family history, past medical history, past social history, past surgical history and problem list.   Objective:   Vitals:   07/14/18 1558  BP: 105/66  Pulse: 98  Weight: 196 lb (88.9 kg)    Fetal Status: Fetal Heart Rate (bpm): 134 Fundal Height: 38 cm Movement: Present     General:  Alert, oriented and cooperative. Patient is in no acute distress.  Skin: Skin is warm and dry. No rash noted.   Cardiovascular: Normal heart rate noted  Respiratory: Normal respiratory effort, no problems with respiration noted  Abdomen: Soft, gravid, appropriate for gestational age.  Pain/Pressure: Present     Pelvic: Cervical exam deferred        Extremities: Normal range of motion.  Edema: Trace  Mental Status: Normal mood and affect. Normal behavior. Normal judgment and thought content.   Assessment and Plan:  Pregnancy: [id number] at [redacted]w[redacted]d 1. Supervision of other normal pregnancy, antepartum -Routine prenatal care. -Anticipatory guidance given. -F/U in 1 week.  2. History of IUFD -Discussed recommendation to induce at 39 weeks due to hx of IUFD along with risks and benefits, but patient declines induction at that time.  Term labor symptoms and general obstetric precautions including but not limited to vaginal bleeding, contractions, leaking of fluid and fetal movement were reviewed in detail with the patient. Please refer to After Visit  Summary for other counseling recommendations.   Return in about 1 week (around 07/21/2018) for ROB.  Future Appointments  Date Time Provider Department Center  07/21/2018 10:10 AM Leftwich-Kirby, Wilmer Floor, CNM [facility]    Louis Matte. Earlene Plater, SNP

## 2018-07-16 ENCOUNTER — Encounter: Payer: Self-pay | Admitting: *Deleted

## 2018-07-21 ENCOUNTER — Encounter: Payer: BC Managed Care – PPO | Admitting: Advanced Practice Midwife

## 2018-07-22 ENCOUNTER — Other Ambulatory Visit: Payer: Self-pay

## 2018-07-22 ENCOUNTER — Ambulatory Visit (INDEPENDENT_AMBULATORY_CARE_PROVIDER_SITE_OTHER): Payer: BC Managed Care – PPO | Admitting: Obstetrics and Gynecology

## 2018-07-22 VITALS — BP 106/76 | HR 128 | Wt 198.0 lb

## 2018-07-22 DIAGNOSIS — Z3A39 39 weeks gestation of pregnancy: Secondary | ICD-10-CM

## 2018-07-22 DIAGNOSIS — Z3480 Encounter for supervision of other normal pregnancy, unspecified trimester: Secondary | ICD-10-CM

## 2018-07-22 DIAGNOSIS — Z8759 Personal history of other complications of pregnancy, childbirth and the puerperium: Secondary | ICD-10-CM

## 2018-07-22 DIAGNOSIS — Z3483 Encounter for supervision of other normal pregnancy, third trimester: Secondary | ICD-10-CM

## 2018-07-22 NOTE — Progress Notes (Signed)
   PRENATAL VISIT NOTE  Subjective:  Danielle Rubio is a 33 y.o. [phone] at [redacted]w[redacted]d being seen today for ongoing prenatal care.  She is currently monitored for the following issues for this low-risk pregnancy and has History of IUFD and Supervision of other normal pregnancy, antepartum on their problem list.  Patient reports no complaints.  Contractions: Irritability. Vag. Bleeding: None.  Movement: Present. Denies leaking of fluid.   The following portions of the patient's history were reviewed and updated as appropriate: allergies, current medications, past family history, past medical history, past social history, past surgical history and problem list.   Objective:   Vitals:   07/22/18 0830  BP: 106/76  Pulse: (!) 128  Weight: 198 lb (89.8 kg)    Fetal Status: Fetal Heart Rate (bpm): 142 Fundal Height: 38 cm Movement: Present     General:  Alert, oriented and cooperative. Patient is in no acute distress.  Skin: Skin is warm and dry. No rash noted.   Cardiovascular: Normal heart rate noted  Respiratory: Normal respiratory effort, no problems with respiration noted  Abdomen: Soft, gravid, appropriate for gestational age.  Pain/Pressure: Present     Pelvic: Cervical exam deferred        Extremities: Normal range of motion.  Edema: Trace  Mental Status: Normal mood and affect. Normal behavior. Normal judgment and thought content.   Assessment and Plan:  Pregnancy: [id number] at [redacted]w[redacted]d  1. History of IUFD  Declines Ante testing  Not interested in induction at this time.   2. Supervision of other normal pregnancy, antepartum  Doing well today NST and BPP next week, she is agreeable Not interested in discussing induction at this time. Other children born after 41 weeks Good fetal movement.  Negative GBS    There are no diagnoses linked to this encounter. Term labor symptoms and general obstetric precautions including but not limited to vaginal bleeding, contractions, leaking  of fluid and fetal movement were reviewed in detail with the patient. Please refer to After Visit Summary for other counseling recommendations.   Return in about 1 week (around 07/29/2018) for BPP and NST please .  Future Appointments  Date Time Provider Department Center  07/28/2018  9:30 AM Sharyon Cable, CNM [facility]    Venia Carbon, NP

## 2018-07-28 ENCOUNTER — Encounter: Payer: Self-pay | Admitting: Certified Nurse Midwife

## 2018-07-28 ENCOUNTER — Other Ambulatory Visit: Payer: Self-pay

## 2018-07-28 ENCOUNTER — Ambulatory Visit (INDEPENDENT_AMBULATORY_CARE_PROVIDER_SITE_OTHER): Payer: BC Managed Care – PPO | Admitting: Certified Nurse Midwife

## 2018-07-28 VITALS — BP 96/66 | HR 101 | Wt 196.0 lb

## 2018-07-28 DIAGNOSIS — O09293 Supervision of pregnancy with other poor reproductive or obstetric history, third trimester: Secondary | ICD-10-CM

## 2018-07-28 DIAGNOSIS — Z3480 Encounter for supervision of other normal pregnancy, unspecified trimester: Secondary | ICD-10-CM

## 2018-07-28 DIAGNOSIS — Z3A40 40 weeks gestation of pregnancy: Secondary | ICD-10-CM

## 2018-07-28 DIAGNOSIS — Z8759 Personal history of other complications of pregnancy, childbirth and the puerperium: Secondary | ICD-10-CM

## 2018-07-28 NOTE — Patient Instructions (Signed)
Reasons to go to MAU:  1.  Contractions are  5 minutes apart or less, each last 1 minute, these have been going on for 1-2 hours, and you cannot walk or talk during them 2.  You have a large gush of fluid, or a trickle of fluid that will not stop and you have to wear a pad 3.  You have bleeding that is bright red, heavier than spotting--like menstrual bleeding (spotting can be normal in early labor or after a check of your cervix) 4.  You do not feel the baby moving like he/she normally does  

## 2018-07-28 NOTE — Progress Notes (Signed)
   PRENATAL VISIT NOTE  Subjective:  Danielle Rubio is a 33 y.o. [phone] at [redacted]w[redacted]d being seen today for ongoing prenatal care.  She is currently monitored for the following issues for this low-risk pregnancy and has History of IUFD and Supervision of other normal pregnancy, antepartum on their problem list.  Patient reports occasional contractions.  Contractions: Irritability. Vag. Bleeding: None.  Movement: Present. Denies leaking of fluid.   The following portions of the patient's history were reviewed and updated as appropriate: allergies, current medications, past family history, past medical history, past social history, past surgical history and problem list.   Objective:   Vitals:   07/28/18 0930  BP: 96/66  Pulse: (!) 101  Weight: 196 lb (88.9 kg)    Fetal Status: Fetal Heart Rate (bpm): 142   Movement: Present  Presentation: Vertex  General:  Alert, oriented and cooperative. Patient is in no acute distress.  Skin: Skin is warm and dry. No rash noted.   Cardiovascular: Normal heart rate noted  Respiratory: Normal respiratory effort, no problems with respiration noted  Abdomen: Soft, gravid, appropriate for gestational age.  Pain/Pressure: Present     Pelvic: Cervical exam deferred        Extremities: Normal range of motion.  Edema: Trace  Mental Status: Normal mood and affect. Normal behavior. Normal judgment and thought content.   Assessment and Plan:  Pregnancy: [id number] at [redacted]w[redacted]d 1. Supervision of other normal pregnancy, antepartum - Patient doing well  - Discussed new policy changes due to COVID 19 with the removal of waterbirths at this time - Educated and discussed laboring in shower and different positions to deliver  - NST reactive and AFI normal (16.4)   2. History of IUFD - Declined antenatal testing during this pregnancy and does not want to discuss induction at this time  - Repeat NST on Friday 4/3, with discussion of induction at that appointment   Term  labor symptoms and general obstetric precautions including but not limited to vaginal bleeding, contractions, leaking of fluid and fetal movement were reviewed in detail with the patient. Please refer to After Visit Summary for other counseling recommendations.   Return in about 3 days (around 07/31/2018) for ROB, NST.  Future Appointments  Date Time Provider Department Center  07/31/2018  8:15 AM Donette Larry, CNM [facility]    Sharyon Cable, CNM

## 2018-07-31 ENCOUNTER — Ambulatory Visit (INDEPENDENT_AMBULATORY_CARE_PROVIDER_SITE_OTHER): Payer: BC Managed Care – PPO | Admitting: Certified Nurse Midwife

## 2018-07-31 ENCOUNTER — Other Ambulatory Visit: Payer: Self-pay

## 2018-07-31 VITALS — BP 110/75 | HR 107 | Wt 196.0 lb

## 2018-07-31 DIAGNOSIS — O480 Post-term pregnancy: Secondary | ICD-10-CM | POA: Diagnosis not present

## 2018-07-31 DIAGNOSIS — Z3480 Encounter for supervision of other normal pregnancy, unspecified trimester: Secondary | ICD-10-CM

## 2018-07-31 DIAGNOSIS — Z3A40 40 weeks gestation of pregnancy: Secondary | ICD-10-CM

## 2018-07-31 NOTE — Progress Notes (Signed)
Subjective:  Danielle Rubio is a 33 y.o. [phone] at [redacted]w[redacted]d being seen today for ongoing prenatal care.  She is currently monitored for the following issues for this high-risk pregnancy and has History of IUFD and Supervision of other normal pregnancy, antepartum on their problem list.  Patient reports no complaints.  Contractions: Irritability. Vag. Bleeding: None.  Movement: Present. Denies leaking of fluid.   The following portions of the patient's history were reviewed and updated as appropriate: allergies, current medications, past family history, past medical history, past social history, past surgical history and problem list. Problem list updated.  Objective:   Vitals:   07/31/18 0815  BP: 110/75  Pulse: (!) 107  Weight: 88.9 kg    Fetal Status: Fetal Heart Rate (bpm): 125   Movement: Present  Presentation: Vertex  General:  Alert, oriented and cooperative. Patient is in no acute distress.  Skin: Skin is warm and dry. No rash noted.   Cardiovascular: Normal heart rate noted  Respiratory: Normal respiratory effort, no problems with respiration noted  Abdomen: Soft, gravid, appropriate for gestational age. Pain/Pressure: Present     Pelvic: Vag. Bleeding: None Vag D/C Character: Thin   Cervical exam deferred        Extremities: Normal range of motion.  Edema: Trace  Mental Status: Normal mood and affect. Normal behavior. Normal judgment and thought content.   Urinalysis:      Assessment and Plan:  Pregnancy: [id number] at [redacted]w[redacted]d  1. Supervision of other normal pregnancy  2. Post dates pregnancy - recommend IOL at 41 weeks, pt prefers to wait for spontaneous labor, discussed increased risk of IUFD with post dates pregnancy >41 weeks, declines scheduling IOL, believes "baby will come when its ready". When asked at what gestational age she would feel comfortable being induced, states "I don't know". Notified her we do not recommend waiting until 42 weeks. - recommend ante testing  twice weekly, pt agrees  Term labor symptoms and general obstetric precautions including but not limited to vaginal bleeding, contractions, leaking of fluid and fetal movement were reviewed in detail with the patient. Please refer to After Visit Summary for other counseling recommendations.  Return in about 4 days (around 08/04/2018).   Donette Larry, CNM

## 2018-07-31 NOTE — Patient Instructions (Signed)
Fetal Movement Counts Patient Name: ________________________________________________ Patient Due Date: ____________________ What is a fetal movement count?  A fetal movement count is the number of times that you feel your baby move during a certain amount of time. This may also be called a fetal kick count. A fetal movement count is recommended for every pregnant woman. You may be asked to start counting fetal movements as early as week 28 of your pregnancy. Pay attention to when your baby is most active. You may notice your baby's sleep and wake cycles. You may also notice things that make your baby move more. You should do a fetal movement count:  When your baby is normally most active.  At the same time each day. A good time to count movements is while you are resting, after having something to eat and drink. How do I count fetal movements? 1. Find a quiet, comfortable area. Sit, or lie down on your side. 2. Write down the date, the start time and stop time, and the number of movements that you felt between those two times. Take this information with you to your health care visits. 3. For 2 hours, count kicks, flutters, swishes, rolls, and jabs. You should feel at least 10 movements during 2 hours. 4. You may stop counting after you have felt 10 movements. 5. If you do not feel 10 movements in 2 hours, have something to eat and drink. Then, keep resting and counting for 1 hour. If you feel at least 4 movements during that hour, you may stop counting. Contact a health care provider if:  You feel fewer than 4 movements in 2 hours.  Your baby is not moving like he or she usually does. Date: ____________ Start time: ____________ Stop time: ____________ Movements: ____________ Date: ____________ Start time: ____________ Stop time: ____________ Movements: ____________ Date: ____________ Start time: ____________ Stop time: ____________ Movements: ____________ Date: ____________ Start time:  ____________ Stop time: ____________ Movements: ____________ Date: ____________ Start time: ____________ Stop time: ____________ Movements: ____________ Date: ____________ Start time: ____________ Stop time: ____________ Movements: ____________ Date: ____________ Start time: ____________ Stop time: ____________ Movements: ____________ Date: ____________ Start time: ____________ Stop time: ____________ Movements: ____________ Date: ____________ Start time: ____________ Stop time: ____________ Movements: ____________ This information is not intended to replace advice given to you by your health care provider. Make sure you discuss any questions you have with your health care provider. Document Released: 05/15/2006 Document Revised: 12/13/2015 Document Reviewed: 05/25/2015 Elsevier Interactive Patient Education  2019 [employer] Braxton Hicks Contractions Contractions of the uterus can occur throughout pregnancy, but they are not always a sign that you are in labor. You may have practice contractions called Braxton Hicks contractions. These false labor contractions are sometimes confused with true labor. What are Braxton Hicks contractions? Braxton Hicks contractions are tightening movements that occur in the muscles of the uterus before labor. Unlike true labor contractions, these contractions do not result in opening (dilation) and thinning of the cervix. Toward the end of pregnancy (32-34 weeks), Braxton Hicks contractions can happen more often and may become stronger. These contractions are sometimes difficult to tell apart from true labor because they can be very uncomfortable. You should not feel embarrassed if you go to the hospital with false labor. Sometimes, the only way to tell if you are in true labor is for your health care provider to look for changes in the cervix. The health care provider will do a physical exam and may monitor your contractions. If   you are not in true labor, the exam  should show that your cervix is not dilating and your water has not broken. If there are no other health problems associated with your pregnancy, it is completely safe for you to be sent home with false labor. You may continue to have Braxton Hicks contractions until you go into true labor. How to tell the difference between true labor and false labor True labor  Contractions last 30-70 seconds.  Contractions become very regular.  Discomfort is usually felt in the top of the uterus, and it spreads to the lower abdomen and low back.  Contractions do not go away with walking.  Contractions usually become more intense and increase in frequency.  The cervix dilates and gets thinner. False labor  Contractions are usually shorter and not as strong as true labor contractions.  Contractions are usually irregular.  Contractions are often felt in the front of the lower abdomen and in the groin.  Contractions may go away when you walk around or change positions while lying down.  Contractions get weaker and are shorter-lasting as time goes on.  The cervix usually does not dilate or become thin. Follow these instructions at home:   Take over-the-counter and prescription medicines only as told by your health care provider.  Keep up with your usual exercises and follow other instructions from your health care provider.  Eat and drink lightly if you think you are going into labor.  If Braxton Hicks contractions are making you uncomfortable: ? Change your position from lying down or resting to walking, or change from walking to resting. ? Sit and rest in a tub of warm water. ? Drink enough fluid to keep your urine pale yellow. Dehydration may cause these contractions. ? Do slow and deep breathing several times an hour.  Keep all follow-up prenatal visits as told by your health care provider. This is important. Contact a health care provider if:  You have a fever.  You have continuous  pain in your abdomen. Get help right away if:  Your contractions become stronger, more regular, and closer together.  You have fluid leaking or gushing from your vagina.  You pass blood-tinged mucus (bloody show).  You have bleeding from your vagina.  You have low back pain that you never had before.  You feel your baby's head pushing down and causing pelvic pressure.  Your baby is not moving inside you as much as it used to. Summary  Contractions that occur before labor are called Braxton Hicks contractions, false labor, or practice contractions.  Braxton Hicks contractions are usually shorter, weaker, farther apart, and less regular than true labor contractions. True labor contractions usually become progressively stronger and regular, and they become more frequent.  Manage discomfort from Braxton Hicks contractions by changing position, resting in a warm bath, drinking plenty of water, or practicing deep breathing. This information is not intended to replace advice given to you by your health care provider. Make sure you discuss any questions you have with your health care provider. Document Released: 08/29/2016 Document Revised: 01/28/2017 Document Reviewed: 08/29/2016 Elsevier Interactive Patient Education  2019 Elsevier Inc.  

## 2018-08-01 ENCOUNTER — Other Ambulatory Visit: Payer: Self-pay

## 2018-08-01 ENCOUNTER — Encounter (HOSPITAL_COMMUNITY): Payer: Self-pay

## 2018-08-01 ENCOUNTER — Inpatient Hospital Stay (HOSPITAL_COMMUNITY)
Admission: AD | Admit: 2018-08-01 | Discharge: 2018-08-03 | DRG: 806 | Disposition: A | Discharge status: Home / Self Care | Payer: BC Managed Care – PPO | Attending: Obstetrics and Gynecology | Admitting: Obstetrics and Gynecology

## 2018-08-01 DIAGNOSIS — Z3A40 40 weeks gestation of pregnancy: Secondary | ICD-10-CM | POA: Diagnosis not present

## 2018-08-01 DIAGNOSIS — O322XX0 Maternal care for transverse and oblique lie, not applicable or unspecified: Secondary | ICD-10-CM | POA: Diagnosis present

## 2018-08-01 DIAGNOSIS — O480 Post-term pregnancy: Secondary | ICD-10-CM

## 2018-08-01 DIAGNOSIS — Z3480 Encounter for supervision of other normal pregnancy, unspecified trimester: Secondary | ICD-10-CM

## 2018-08-01 DIAGNOSIS — Z8759 Personal history of other complications of pregnancy, childbirth and the puerperium: Secondary | ICD-10-CM

## 2018-08-01 DIAGNOSIS — O26893 Other specified pregnancy related conditions, third trimester: Secondary | ICD-10-CM | POA: Diagnosis present

## 2018-08-01 LAB — POCT FERN TEST: POCT Fern Test: POSITIVE

## 2018-08-01 LAB — CBC
HCT: 34.2 % — ABNORMAL LOW (ref 36.0–46.0)
Hemoglobin: 11.3 g/dL — ABNORMAL LOW (ref 12.0–15.0)
MCH: 30.5 pg (ref 26.0–34.0)
MCHC: 33 g/dL (ref 30.0–36.0)
MCV: 92.4 fL (ref 80.0–100.0)
Platelets: 143 10*3/uL — ABNORMAL LOW (ref 150–400)
RBC: 3.7 MIL/uL — ABNORMAL LOW (ref 3.87–5.11)
RDW: 13 % (ref 11.5–15.5)
WBC: 16.4 10*3/uL — ABNORMAL HIGH (ref 4.0–10.5)
nRBC: 0 % (ref 0.0–0.2)

## 2018-08-01 LAB — TYPE AND SCREEN
ABO/RH(D): A POS
Antibody Screen: NEGATIVE

## 2018-08-01 MED ORDER — METHYLERGONOVINE MALEATE 0.2 MG/ML IJ SOLN
0.2000 mg | Freq: Once | INTRAMUSCULAR | Status: AC
  Administered 2018-08-01: 0.2 mg via INTRAMUSCULAR

## 2018-08-01 MED ORDER — OXYCODONE-ACETAMINOPHEN 5-325 MG PO TABS: 1.0000 | ORAL_TABLET | ORAL | Status: DC | PRN

## 2018-08-01 MED ORDER — METHYLERGONOVINE MALEATE 0.2 MG/ML IJ SOLN
INTRAMUSCULAR | Status: AC
  Administered 2018-08-01: 0.2 mg via INTRAMUSCULAR
  Filled 2018-08-01: qty 1

## 2018-08-01 MED ORDER — LIDOCAINE HCL (PF) 1 % IJ SOLN: 30.0000 mL | INTRAMUSCULAR | Status: DC | PRN

## 2018-08-01 MED ORDER — OXYTOCIN 10 UNIT/ML IJ SOLN
INTRAMUSCULAR | Status: AC
  Administered 2018-08-01: 10 [IU]
  Filled 2018-08-01: qty 1

## 2018-08-01 MED ORDER — ONDANSETRON HCL 4 MG/2ML IJ SOLN: 4.0000 mg | Freq: Four times a day (QID) | INTRAMUSCULAR | Status: DC | PRN

## 2018-08-01 MED ORDER — ACETAMINOPHEN 325 MG PO TABS: 650.0000 mg | ORAL_TABLET | ORAL | Status: DC | PRN

## 2018-08-01 MED ORDER — OXYCODONE-ACETAMINOPHEN 5-325 MG PO TABS: 2.0000 | ORAL_TABLET | ORAL | Status: DC | PRN

## 2018-08-01 MED ORDER — OXYTOCIN BOLUS FROM INFUSION: 500.0000 mL | Freq: Once | INTRAVENOUS | Status: DC

## 2018-08-01 MED ORDER — LACTATED RINGERS IV SOLN: 500.0000 mL | INTRAVENOUS | Status: DC | PRN

## 2018-08-01 MED ORDER — LACTATED RINGERS IV SOLN: INTRAVENOUS | Status: DC

## 2018-08-01 MED ORDER — SOD CITRATE-CITRIC ACID 500-334 MG/5ML PO SOLN: 30.0000 mL | ORAL | Status: DC | PRN

## 2018-08-01 MED ORDER — OXYTOCIN 40 UNITS IN NORMAL SALINE INFUSION - SIMPLE MED: 2.5000 [IU]/h | INTRAVENOUS | Status: DC

## 2018-08-01 NOTE — MAU Note (Signed)
Presents with cxts every 4-6 min for 1.5 hours. Pain 6/10. ? ROM. Lost mucus plug yesterday. Dec FM today.   Adah Perl RN

## 2018-08-01 NOTE — Discharge Summary (Signed)
Obstetrics Discharge Summary [facility]   Patient Name: Danielle Rubio DOB: Jul 04, 1985 MRN: [id number]  Date of admission: 08/01/2018 Delivering MD: Tamera Stands   Date of discharge: 08/03/2018  Admitting diagnosis: 40 weeks labor  Intrauterine pregnancy: [redacted]w[redacted]d     Secondary diagnosis:   Active Problems:   History of IUFD   Indication for care in labor or delivery    Discharge diagnosis: Term Pregnancy Delivered                                            Postpartum procedures: None  Complications: None  Outpatient Follow-Up: [x]  continue to counsel on method of contraception- patient declines birth control   Hospital course: Danielle Rubio is a 33 y.o. [redacted]w[redacted]d who was admitted for spontaneous onset of labor, SROM. Her pregnancy was complicated by history of IUFD. Her labor course was notable for admission in active labor with SROM just prior to arrival. Delivery was complicated by PPH with EBL of nearly 1L - received IM pitocin, methergine. Please see delivery/op note for additional details. Her postpartum course was uncomplicated. She was breastfeeding without difficulty. By day of discharge, she was passing flatus, urinating, eating and drinking without difficulty. Her pain was well-controlled, and she was discharged home to take ibuprofen at home. She will follow-up in clinic in 4 weeks via telehealth appointment.   Physical exam  Vitals:   08/02/18 1007 08/02/18 1554 08/02/18 2311 08/03/18 0500  BP: 100/68 112/67 119/68 102/70  Pulse: 64 83 72 76  Resp: 17 16 17 18   Temp: 98.2 F (36.8 C) 98.1 F (36.7 C)  98.1 F (36.7 C)  TempSrc: Axillary Oral  Oral  SpO2:    98%  Weight:      Height:       General: alert, no distress Lochia: appropriate Uterine Fundus: firm Incision: N/A DVT Evaluation: No evidence of DVT seen on physical exam. No significant calf/ankle edema. Labs: Lab Results  Component Value Date   WBC 17.5 (H) 08/02/2018   HGB 11.5 (L)  08/02/2018   HCT 33.8 (L) 08/02/2018   MCV 90.6 08/02/2018   PLT 142 (L) 08/02/2018   No flowsheet data found.  Discharge instructions: Per After Visit Summary and "Baby and Me Booklet"  After visit meds:  Allergies as of 08/03/2018      Reactions   Lac Bovis Itching   Acne   Gluten Meal Other (See Comments)   GI effects   Lactose Intolerance (gi) Other (See Comments)   GI Upset      Medication List    TAKE these medications   PNV PO Take by mouth.       Postpartum contraception: None Diet: Routine Diet Activity: Advance as tolerated. Pelvic rest for 6 weeks.   Please schedule this patient for Postpartum visit in: 4 weeks with the following provider: Any provider (Telehealth visit due to social distancing) Low risk pregnancy complicated by: hx IUFD Delivery mode:  SVD Anticipated Birth Control:  other/unsure PP Procedures needed: none  Schedule Integrated BH visit: no  Newborn Data: Live born female  Birth Weight: 4190 APGAR: 8, 9  Newborn Delivery   Birth date/time:  08/01/2018 22:39:00 Delivery type:  Vaginal, Spontaneous     Baby Feeding: Breast Disposition:home with mother  Sharyon Cable, CNM 08/03/18, 6:49 AM

## 2018-08-01 NOTE — H&P (Signed)
OBSTETRIC ADMISSION HISTORY AND PHYSICAL  Danielle Rubio is a 33 y.o. female [phone] with IUP at [redacted]w[redacted]d by L/19 presenting for spontaneous onset of labor, SROM.   Reports fetal movement. Denies vaginal bleeding.  She received her prenatal care at [facility].  Support person in labor: Husband Lawyer . 19w0: EFW 47%, anterior placenta  normal anatomy U/S  Prenatal History/Complications: . History of IUFD  Past Medical History: Past Medical History:  Diagnosis Date  . Medical history non-contributory     Past Surgical History: Past Surgical History:  Procedure Laterality Date  . NO PAST SURGERIES    . WISDOM TOOTH EXTRACTION      Obstetrical History: OB History    Gravida  4   Para  3   Term  2   Preterm  1   AB      Living  2     SAB      TAB      Ectopic      Multiple  0   Live Births  2        Obstetric Comments  IUFD        Social History: Social History   Socioeconomic History  . Marital status: Married    Spouse name: Not on file  . Number of children: Not on file  . Years of education: Not on file  . Highest education level: Not on file  Occupational History  . Not on file  Social Needs  . Financial resource strain: Not on file  . Food insecurity:    Worry: Not on file    Inability: Not on file  . Transportation needs:    Medical: Not on file    Non-medical: Not on file  Tobacco Use  . Smoking status: Never Smoker  . Smokeless tobacco: Never Used  Substance and Sexual Activity  . Alcohol use: No  . Drug use: No  . Sexual activity: Yes    Birth control/protection: None    Comment: Last-yesterday  Lifestyle  . Physical activity:    Days per week: Not on file    Minutes per session: Not on file  . Stress: Not on file  Relationships  . Social connections:    Talks on phone: Not on file    Gets together: Not on file    Attends religious service: Not on file    Active member of club or organization: Not on  file    Attends meetings of clubs or organizations: Not on file    Relationship status: Not on file  Other Topics Concern  . Not on file  Social History Narrative  . Not on file    Family History: Family History  Problem Relation Age of Onset  . Cancer Maternal Aunt        bladder cancer  . Cancer Maternal Grandfather        pancreatic  . Alzheimer's disease Maternal Grandmother     Allergies: Allergies  Allergen Reactions  . Lac Bovis Itching    Acne  . Gluten Meal Other (See Comments)    GI effects  . Lactose Intolerance (Gi) Other (See Comments)    GI Upset    Medications Prior to Admission  Medication Sig Dispense Refill Last Dose  . Prenatal Vit w/Fe-Methylfol-FA (PNV PO) Take by mouth.   Taking     Review of Systems  All systems reviewed and negative except as stated in HPI  Blood pressure 112/70, pulse (!) 105,  temperature 98.5 F (36.9 C), temperature source Oral, resp. rate 18, height 5\' 6"  (1.676 m), weight 90.1 kg, last menstrual period 10/21/2017, SpO2 100 %. General appearance: alert, well-appearing, mildly uncomfortable appearing Lungs: no respiratory distress Heart: regular rate  Abdomen: soft, non-tender; gravid  Pelvic: deferred Extremities: no significant LE edema Presentation: cephalic by prior check Fetal monitoring: 130s/mod/+a/-d Uterine activity: irregular Dilation: 7 Effacement (%): 80 Station: -1 Exam by:: Adah Perl RN(and Quintella Baton RN)  Prenatal labs: ABO, Rh: --/--/PENDING (04/04 2231) Antibody: PENDING (04/04 2231) Rubella: 3.59 (08/19 1636) RPR: NON-REACTIVE (01/07 0851)  HBsAg: NON-REACTIVE (08/19 1636)  HIV: NON-REACTIVE (01/07 0851)  GBS: Negative (03/03 0000)  Glucola: normal 2-hr GTT Genetic screening:  declined  Prenatal Transfer Tool  Maternal Diabetes: No Genetic Screening: Declined Maternal Ultrasounds/Referrals: Normal Fetal Ultrasounds or other Referrals:  None Maternal Substance Abuse:   No Significant Maternal Medications:  None Significant Maternal Lab Results: None  Results for orders placed or performed during the hospital encounter of 08/01/18 (from the past 24 hour(s))  POCT fern test   Collection Time: 08/01/18  8:40 PM  Result Value Ref Range   POCT Fern Test Positive = ruptured amniotic membanes   CBC   Collection Time: 08/01/18 10:31 PM  Result Value Ref Range   WBC 16.4 (H) 4.0 - 10.5 K/uL   RBC 3.70 (L) 3.87 - 5.11 MIL/uL   Hemoglobin 11.3 (L) 12.0 - 15.0 g/dL   HCT 06.2 (L) 69.4 - 85.4 %   MCV 92.4 80.0 - 100.0 fL   MCH 30.5 26.0 - 34.0 pg   MCHC 33.0 30.0 - 36.0 g/dL   RDW 62.7 03.5 - 00.9 %   Platelets 143 (L) 150 - 400 K/uL   nRBC 0.0 0.0 - 0.2 %  Type and screen [facility]   Collection Time: 08/01/18 10:31 PM  Result Value Ref Range   ABO/RH(D) PENDING    Antibody Screen PENDING    Sample Expiration      08/04/2018 Performed at [facility], [address], [place], [place] [address]     Patient Active Problem List   Diagnosis Date Noted  . Indication for care in labor or delivery 08/01/2018  . Supervision of other normal pregnancy, antepartum 12/15/2017  . History of IUFD 11/19/2017    Assessment/Plan:  Danielle Rubio is a 33 y.o. [id number] at [redacted]w[redacted]d here for spontaneous onset of labor, SROM.   Labor: SOL/SROM. Expectant management.  -- pain control: declines  Fetal Wellbeing: EFW 8-9 by Leopold's. Cephalic by sutures on prior check.  -- GBS (negative) -- continuous fetal monitoring - category I   Postpartum Planning -- breast/?? -- RI/[x] Tdap   Vashti Bolanos S. Earlene Plater, DO OB/GYN Fellow

## 2018-08-02 ENCOUNTER — Encounter (HOSPITAL_COMMUNITY): Payer: Self-pay | Admitting: *Deleted

## 2018-08-02 LAB — CBC
HCT: 33.8 % — ABNORMAL LOW (ref 36.0–46.0)
Hemoglobin: 11.5 g/dL — ABNORMAL LOW (ref 12.0–15.0)
MCH: 30.8 pg (ref 26.0–34.0)
MCHC: 34 g/dL (ref 30.0–36.0)
MCV: 90.6 fL (ref 80.0–100.0)
Platelets: 142 10*3/uL — ABNORMAL LOW (ref 150–400)
RBC: 3.73 MIL/uL — ABNORMAL LOW (ref 3.87–5.11)
RDW: 12.5 % (ref 11.5–15.5)
WBC: 17.5 10*3/uL — ABNORMAL HIGH (ref 4.0–10.5)
nRBC: 0 % (ref 0.0–0.2)

## 2018-08-02 LAB — RPR: RPR Ser Ql: NONREACTIVE

## 2018-08-02 MED ORDER — ZOLPIDEM TARTRATE 5 MG PO TABS: 5.0000 mg | ORAL_TABLET | Freq: Every evening | ORAL | Status: DC | PRN

## 2018-08-02 MED ORDER — SENNOSIDES-DOCUSATE SODIUM 8.6-50 MG PO TABS: 2.0000 | ORAL_TABLET | ORAL | Status: DC

## 2018-08-02 MED ORDER — IBUPROFEN 600 MG PO TABS: 600.0000 mg | ORAL_TABLET | Freq: Four times a day (QID) | ORAL | Status: DC

## 2018-08-02 MED ORDER — DIPHENHYDRAMINE HCL 25 MG PO CAPS: 25.0000 mg | ORAL_CAPSULE | Freq: Four times a day (QID) | ORAL | Status: DC | PRN

## 2018-08-02 MED ORDER — SIMETHICONE 80 MG PO CHEW: 80.0000 mg | CHEWABLE_TABLET | ORAL | Status: DC | PRN

## 2018-08-02 MED ORDER — COCONUT OIL OIL: 1.0000 "application " | TOPICAL_OIL | Status: DC | PRN

## 2018-08-02 MED ORDER — ONDANSETRON HCL 4 MG PO TABS: 4.0000 mg | ORAL_TABLET | ORAL | Status: DC | PRN

## 2018-08-02 MED ORDER — MEASLES, MUMPS & RUBELLA VAC IJ SOLR: 0.5000 mL | Freq: Once | INTRAMUSCULAR | Status: DC

## 2018-08-02 MED ORDER — ACETAMINOPHEN 325 MG PO TABS: 650.0000 mg | ORAL_TABLET | ORAL | Status: DC | PRN

## 2018-08-02 MED ORDER — DIBUCAINE 1 % RE OINT: 1.0000 "application " | TOPICAL_OINTMENT | RECTAL | Status: DC | PRN

## 2018-08-02 MED ORDER — PRENATAL MULTIVITAMIN CH: 1.0000 | ORAL_TABLET | Freq: Every day | ORAL | Status: DC

## 2018-08-02 MED ORDER — WITCH HAZEL-GLYCERIN EX PADS: 1.0000 "application " | MEDICATED_PAD | CUTANEOUS | Status: DC | PRN

## 2018-08-02 MED ORDER — BENZOCAINE-MENTHOL 20-0.5 % EX AERO: 1.0000 "application " | INHALATION_SPRAY | CUTANEOUS | Status: DC | PRN

## 2018-08-02 MED ORDER — TETANUS-DIPHTH-ACELL PERTUSSIS 5-2.5-18.5 LF-MCG/0.5 IM SUSP: 0.5000 mL | Freq: Once | INTRAMUSCULAR | Status: DC

## 2018-08-02 MED ORDER — ONDANSETRON HCL 4 MG/2ML IJ SOLN: 4.0000 mg | INTRAMUSCULAR | Status: DC | PRN

## 2018-08-02 NOTE — Progress Notes (Signed)
Patient would prefer to have limited rounding.

## 2018-08-02 NOTE — Lactation Note (Signed)
This note was copied from a baby's chart. Lactation Consultation Note  Patient Name: Danielle Rubio SEGBT'D Date: 08/02/2018 Reason for consult: Initial assessment;Term P3, 6 hour female infant. Infant had one stool since delivery. Per mom, infant latched well in L&D.  Mom feels confident in her breastfeeding abilities, she breastfeed her 2nd child for 13 months. This is mom 2nd time breastfeeding, she thought she was to breastfeed infant every 4 hours. LC reviewed with Mom to breastfeed according hunger cues, 8 or more times within 24 hours. Mom has DEBP at home. Mom latched infant on left breast using cross cradle hold, infant latched with wide gape nose to breast with swallows observed. Infant was still breastfeeding when LC left room ( 15 minutes).  Mom doesn't feel she needs LC services, she will call LC if she has any questions or concerns. LC discussed I & O. Reviewed Baby & Me book's Breastfeeding Basics.  Mom made aware of O/P services, breastfeeding support groups, community resources, and our phone # for post-discharge questions.  Maternal Data    Feeding Feeding Type: Breast Fed  LATCH Score Latch: Grasps breast easily, tongue down, lips flanged, rhythmical sucking.  Audible Swallowing: Spontaneous and intermittent  Type of Nipple: Everted at rest and after stimulation  Comfort (Breast/Nipple): Soft / non-tender  Hold (Positioning): Assistance needed to correctly position infant at breast and maintain latch.  LATCH Score: 9  Interventions Interventions: Breast feeding basics reviewed;Assisted with latch;Breast massage;Hand express;Breast compression;Adjust position;Support pillows;Position options;Expressed milk  Lactation Tools Discussed/Used     Consult Status Consult Status: Complete    Danelle Earthly 08/02/2018, 5:13 AM

## 2018-08-02 NOTE — Progress Notes (Signed)
Post Partum Day 1 Subjective: no complaints and pt did NOT get water birth that had been desired. pt asks about early d/c this pm but told that normally baby would need reassessment after 24 hours and be eligible for d/c in a.m., as delivery was at 10 pm. Has 2 children at home , under care of Grandparent.  Objective: Blood pressure (!) 98/58, pulse 66, temperature 98.4 F (36.9 C), temperature source Tympanic, resp. rate 18, height 5\' 6"  (1.676 m), weight 90.1 kg, last menstrual period 10/21/2017, SpO2 97 %, unknown if currently breastfeeding.  Physical Exam:  General: alert, cooperative and no distress good interaction with the baby. Breast feeding Lochia: appropriate had initial pph now fine Uterine Fundus: firm Incision:  DVT Evaluation: No evidence of DVT seen on physical exam.  Recent Labs    08/01/18 2231 08/02/18 0434  HGB 11.3* 11.5*  HCT 34.2* 33.8*    Assessment/Plan: Plan for discharge tomorrow   LOS: 1 day   Tilda Burrow 08/02/2018, 9:24 AM

## 2018-08-03 LAB — ABO/RH: ABO/RH(D): A POS

## 2018-08-03 NOTE — Lactation Note (Signed)
This note was copied from a baby's chart. Lactation Consultation Note  Patient Name: Danielle Rubio [id number] Date: 08/03/2018 Reason for consult: Follow-up assessment  Baby is 5 hours old  As LC entered the room / baby latched and mom reports swallows And comfort/ breast are fuller.  LC reassured signs her milk is coming in.  Sore nipple and engorgement prevention and tx reviewed.  Per mom has DEBP at home.  Discussed nutritive vs non - nutritive feeding patterns and the importance of  Watching the baby for hanging out latched. Storage of breast milk.   Importance of STS feedings.  Mom expressed she is ready to be D/C and has not seen Pedis.  LC mentioned she would notify the MD and the RN caring for her.    Maternal Data Does the patient have breastfeeding experience prior to this delivery?: Yes  Feeding Feeding Type: Breast Fed  LATCH Score                   Interventions Interventions: Breast feeding basics reviewed  Lactation Tools Discussed/Used Pump Review: Milk Storage   Consult Status Consult Status: Complete Date: 08/03/18    Danielle Rubio 08/03/2018, 10:40 AM

## 2018-08-04 ENCOUNTER — Ambulatory Visit: Payer: BC Managed Care – PPO

## 2018-09-01 ENCOUNTER — Ambulatory Visit (INDEPENDENT_AMBULATORY_CARE_PROVIDER_SITE_OTHER): Payer: BC Managed Care – PPO | Admitting: Advanced Practice Midwife

## 2018-09-01 ENCOUNTER — Other Ambulatory Visit: Payer: Self-pay

## 2018-09-01 DIAGNOSIS — Z1389 Encounter for screening for other disorder: Secondary | ICD-10-CM | POA: Diagnosis not present

## 2018-09-01 NOTE — Progress Notes (Addendum)
TELEHEALTH VIRTUAL OBSTETRICS VISIT ENCOUNTER NOTE  I connected with Danielle GrieveAllyson Stoutenburg on 09/02/18 at 10:00 AM EDT by telephone at home and verified that I am speaking with the correct person using two identifiers.   I discussed the limitations, risks, security and privacy concerns of performing an evaluation and management service by telephone and the availability of in person appointments. I also discussed with the patient that there may be a patient responsible charge related to this service. The patient expressed understanding and agreed to proceed.   Appointment Date: 09/01/2018  [facility] [place]  Chief Complaint:  Chief Complaint  Patient presents with  . Postpartum Care    History of Present Illness: Danielle Rubio Radi is a 33 y.o. Caucasian [phone] (Patient's last menstrual period was 10/21/2017.), seen for the above chief complaint. Her past medical history is significant for IUFD.   She is s/p normal spontaneous vaginal delivery on 08/01/18 at 40 weeks; she was discharged to home on 08/03/18 D#2. Pregnancy complicated by hx IUFD in previous pregnancy, otherwise normal. Baby is doing well, breastfeeding.  Complains of increase in bleeding last week, now improved and spotting only.   Vaginal bleeding or discharge: Yes  Mode of feeding infant: Breast Intercourse: No  Contraception: vasectomy and condoms PP depression s/s: No .  Any bowel or bladder issues: No  Pap smear: no abnormalities (date: 2018)  Review of Systems: Positive for n/a. Her 12 point review of systems is negative or as noted in the History of Present Illness.  Patient Active Problem List   Diagnosis Date Noted  . Indication for care in labor or delivery 08/01/2018  . Supervision of other normal pregnancy, antepartum 12/15/2017  . History of IUFD 11/19/2017    Medications Rethel Malanga had no medications administered during this visit. Current Outpatient Medications  Medication Sig  Dispense Refill  . Prenatal Vit w/Fe-Methylfol-FA (PNV PO) Take by mouth.     No current facility-administered medications for this visit.     Allergies Lac bovis; Gluten meal; and Lactose intolerance (gi)  Physical Exam:  LMP 10/21/2017    General:  Alert, oriented and cooperative. Patient is in no acute distress.  Mental Status: Normal mood and affect. Normal behavior. Normal judgment and thought content.   Respiratory: Normal respiratory effort noted, no problems with respiration noted  Rest of physical exam deferred due to type of encounter  PP Depression Screening:   Edinburgh Postnatal Depression Scale - 09/01/18 0943      Edinburgh Postnatal Depression Scale:  In the Past 7 Days   I have been able to laugh and see the funny side of things.  0    I have looked forward with enjoyment to things.  0    I have blamed myself unnecessarily when things went wrong.  0    I have been anxious or worried for no good reason.  0    I have felt scared or panicky for no good reason.  0    Things have been getting on top of me.  3    I have been so unhappy that I have had difficulty sleeping.  0    I have felt sad or miserable.  0    I have been so unhappy that I have been crying.  0    The thought of harming myself has occurred to me.  0    Edinburgh Postnatal Depression Scale Total  3       Assessment:Patient is  a 33 y.o. [id number] who is 4 weeks postpartum from a normal spontaneous vaginal delivery.  She is doing very well.   Plan:  1. Postpartum examination following vaginal delivery --Doing well.  Bonding well with infant. Good support at home. --Reports bleeding increased last week with some cramping but is now improved and no complications.     RTC 1  Year for well woman exam  I discussed the assessment and treatment plan with the patient. The patient was provided an opportunity to ask questions and all were answered. The patient agreed with the plan and demonstrated an  understanding of the instructions.   The patient was advised to call back or seek an in-person evaluation/go to the ED for any concerning postpartum symptoms.  I provided 12 minutes of non face-to-face time during this encounter.   Sharen Counter, CNM [facility] [employer], [facility]

## 2019-06-23 ENCOUNTER — Encounter: Payer: Self-pay | Admitting: Certified Nurse Midwife
